# Patient Record
Sex: Female | Born: 1985
Health system: Southern US, Community
[De-identification: ages and names within clinical notes are randomized; demographics above are authoritative.]

## PROBLEM LIST (undated history)

## (undated) ENCOUNTER — Inpatient Hospital Stay (HOSPITAL_COMMUNITY): Payer: Self-pay

## (undated) DIAGNOSIS — J45909 Unspecified asthma, uncomplicated: Secondary | ICD-10-CM

## (undated) DIAGNOSIS — Z975 Presence of (intrauterine) contraceptive device: Secondary | ICD-10-CM

## (undated) HISTORY — PX: WISDOM TOOTH EXTRACTION: SHX21

## (undated) HISTORY — DX: Presence of (intrauterine) contraceptive device: Z97.5

---
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
## 2006-08-30 ENCOUNTER — Inpatient Hospital Stay (HOSPITAL_COMMUNITY): Admission: AD | Admit: 2006-08-30 | Discharge: 2006-08-31 | Payer: Self-pay | Admitting: Obstetrics and Gynecology

## 2006-09-04 ENCOUNTER — Inpatient Hospital Stay (HOSPITAL_COMMUNITY): Admission: AD | Admit: 2006-09-04 | Discharge: 2006-09-04 | Payer: Self-pay | Admitting: Obstetrics and Gynecology

## 2006-09-18 ENCOUNTER — Encounter: Admission: RE | Admit: 2006-09-18 | Discharge: 2006-09-18 | Payer: Self-pay | Admitting: Obstetrics and Gynecology

## 2006-09-26 ENCOUNTER — Inpatient Hospital Stay (HOSPITAL_COMMUNITY): Admission: AD | Admit: 2006-09-26 | Discharge: 2006-09-26 | Payer: Self-pay | Admitting: Obstetrics and Gynecology

## 2006-11-19 ENCOUNTER — Inpatient Hospital Stay (HOSPITAL_COMMUNITY): Admission: AD | Admit: 2006-11-19 | Discharge: 2006-11-19 | Payer: Self-pay | Admitting: Obstetrics and Gynecology

## 2006-11-21 ENCOUNTER — Inpatient Hospital Stay (HOSPITAL_COMMUNITY): Admission: AD | Admit: 2006-11-21 | Discharge: 2006-11-21 | Payer: Self-pay | Admitting: Obstetrics and Gynecology

## 2006-11-24 ENCOUNTER — Inpatient Hospital Stay (HOSPITAL_COMMUNITY): Admission: AD | Admit: 2006-11-24 | Discharge: 2006-11-27 | Payer: Self-pay | Admitting: Obstetrics and Gynecology

## 2007-11-03 ENCOUNTER — Emergency Department (HOSPITAL_COMMUNITY): Admission: EM | Admit: 2007-11-03 | Discharge: 2007-11-03 | Payer: Self-pay | Admitting: Emergency Medicine

## 2008-04-26 ENCOUNTER — Emergency Department (HOSPITAL_COMMUNITY): Admission: EM | Admit: 2008-04-26 | Discharge: 2008-04-26 | Payer: Self-pay | Admitting: Emergency Medicine

## 2009-01-04 ENCOUNTER — Emergency Department (HOSPITAL_COMMUNITY): Admission: EM | Admit: 2009-01-04 | Discharge: 2009-01-04 | Payer: Self-pay | Admitting: Emergency Medicine

## 2009-10-04 ENCOUNTER — Emergency Department (HOSPITAL_COMMUNITY): Admission: EM | Admit: 2009-10-04 | Discharge: 2009-10-04 | Payer: Self-pay | Admitting: Emergency Medicine

## 2009-12-29 ENCOUNTER — Emergency Department (HOSPITAL_COMMUNITY)
Admission: EM | Admit: 2009-12-29 | Discharge: 2009-12-29 | Payer: Self-pay | Source: Home / Self Care | Admitting: Family Medicine

## 2010-06-05 NOTE — H&P (Signed)
Katie Riggs, Katie Riggs           ACCOUNT NO.:  [id number]   MEDICAL RECORD NO.:  [id number]          PATIENT TYPE:  INP   LOCATION:  9169                          FACILITY:  [facility]   PHYSICIAN:  Crist Fat. Rivard, M.D. DATE OF BIRTH:  [birth year]   DATE OF ADMISSION:  11/24/2006  DATE OF DISCHARGE:                              HISTORY & PHYSICAL   HISTORY:  This is a 25 year old, gravida 1, para 0 at 41 weeks who  presents for induction of labor secondary to oligohydramnios.  She has  had polyhydramnios and fetal renal cyst in the past which both resolved,  and a repeat ultrasound, today, showed an AFI of 6 and a BPP of 8/8.   HISTORY HAS BEEN REMARKABLE FOR:  1. Late care.  2. Second trimester GC  3. Resolved polyhydramnios.  4. Resolved fetal renal cyst.  5. Group B strep negative.   ALLERGIES:  Seasonal.   OB HISTORY:  The patient is a primigravida.   MEDICAL HISTORY:  Remarkable for childhood varicella.   SURGICAL HISTORY:  Remarkable for wisdom teeth in 2005.   FAMILY HISTORY:  Remarkable for a cousin with anemia, grandfather with  emphysema, and a mother with stroke.   GENETIC HISTORY:  Remarkable for father of the baby's father with twins.   SOCIAL HISTORY:  The patient is single.  Father of the baby, Katie Riggs  is involved and supportive.  She is of the [place] faith.  She denies  any alcohol, tobacco, or drug use.   PRENATAL LABS:  Hemoglobin 12.4, platelets 265.  Blood type A+, antibody  screen negative, RPR nonreactive, rubella immune, hepatitis negative,  HIV negative.  Gonorrhea positive at new OB and chlamydia negative.  Cystic fibrosis declined.   HISTORY OF CURRENT PREGNANCY:  The patient entered care at 19 weeks.  She had quad screen and new OB labs done.  He had an ultrasound that was  normal, and she was treated for UTI at that time.  She had a positive  gonorrhea culture at that time, and was treated with Rocephin.  She had  another ultrasound at  21 weeks to complete anatomy scan.  She had a Pap  result that showed inflammation.  She had test of cure that was negative  at 23 weeks.  Glucola was 130.  She had some preterm labor at 29 weeks  that was treated with terbutaline.  Fetal fibronectin was negative.  She  had a ultrasound at 31 weeks showing an AFI of 36, and was placed on low  carb diet.  She went to a nutritionist for her diet teaching.  She had a  biophysical profile at 32 weeks that was 6/8.  AFI was down to 29, at  that point.  She had a reactive NST.  She had another ultrasound at 34  weeks showing renal cyst had resolved.  Fluid was at 96 percentile.  Fluid level was stable at 35 weeks.  Group B strep was negative.  Cultures were also negative.  She had a biophysical profile 8/8 at 36  weeks, reactive NST at 37 weeks, and a normal  ultrasound.  Ultrasound at  38 weeks showed normal fluid, and she presented at 39 weeks for another  ultrasound that was normal with an AFI of 13.  She presented, today, in  the office of with an AFI of 6.   OBJECTIVE:  VITAL SIGNS:  Stable, afebrile.  HEENT: Within normal limits.  NECK:  Thyroid normal, not enlarged.  CHEST:  Clear to auscultation.  HEART:  Regular rate and rhythm.  ABDOMEN:  Gravid.  Vertex by Thayer Ohm.  FM shows reactive fetal heart  rate with irregular contractions.  Cervix is 1-2 cm, 85% effaced, minus  one station with a vertex presentation.  EXTREMITIES:  Within normal limits.   ASSESSMENT:  1. Intrauterine pregnancy at 41 weeks.  2. Oligohydramnios.   PLAN:  1. Admit to birthing suite per Dr. Estanislado Pandy.  2. Routine CNM orders.  3. Pitocin induction of labor via low dose.      Katie Riggs, C.N.M.      Crist Fat Rivard, M.D.  Electronically Signed    MLW/MEDQ  D:  11/24/2006  T:  11/25/2006  Job:  [id number]

## 2010-10-30 LAB — CBC
HCT: 40.6
Hemoglobin: 14.3
MCHC: 35.1
MCHC: 35.2
MCV: 86.5
Platelets: 217
Platelets: 253
RBC: 3.52 — ABNORMAL LOW
RBC: 4.69
RDW: 13.5
WBC: 8

## 2010-10-31 LAB — URINALYSIS, ROUTINE W REFLEX MICROSCOPIC
Glucose, UA: NEGATIVE
Hgb urine dipstick: NEGATIVE
Specific Gravity, Urine: 1.02
pH: 6

## 2010-10-31 LAB — CBC
MCV: 87
Platelets: 242
RBC: 4.39
WBC: 6.9

## 2010-10-31 LAB — URINE MICROSCOPIC-ADD ON

## 2010-10-31 LAB — DIFFERENTIAL
Eosinophils Absolute: 0.1
Lymphocytes Relative: 20
Lymphs Abs: 1.4
Monocytes Relative: 13 — ABNORMAL HIGH
Neutro Abs: 4.5
Neutrophils Relative %: 65

## 2010-11-02 LAB — WET PREP, GENITAL: Yeast Wet Prep HPF POC: NONE SEEN

## 2010-11-02 LAB — GC/CHLAMYDIA PROBE AMP, GENITAL
Chlamydia, DNA Probe: NEGATIVE
GC Probe Amp, Genital: NEGATIVE

## 2010-11-05 LAB — URINE MICROSCOPIC-ADD ON: WBC, UA: NONE SEEN

## 2010-11-05 LAB — STREP B DNA PROBE: Strep Group B Ag: NEGATIVE

## 2010-11-05 LAB — FETAL FIBRONECTIN: Fetal Fibronectin: NEGATIVE

## 2010-11-05 LAB — URINALYSIS, ROUTINE W REFLEX MICROSCOPIC
Bilirubin Urine: NEGATIVE
Specific Gravity, Urine: 1.025
Urobilinogen, UA: 1
pH: 6

## 2010-11-05 LAB — GC/CHLAMYDIA PROBE AMP, GENITAL: Chlamydia, DNA Probe: NEGATIVE

## 2010-11-05 LAB — WET PREP, GENITAL
Clue Cells Wet Prep HPF POC: NONE SEEN
Yeast Wet Prep HPF POC: NONE SEEN

## 2011-07-31 ENCOUNTER — Other Ambulatory Visit (HOSPITAL_COMMUNITY)
Admission: RE | Admit: 2011-07-31 | Discharge: 2011-07-31 | Disposition: A | Discharge status: Home / Self Care | Payer: 59 | Source: Ambulatory Visit | Attending: Family Medicine | Admitting: Family Medicine

## 2011-07-31 DIAGNOSIS — Z01419 Encounter for gynecological examination (general) (routine) without abnormal findings: Secondary | ICD-10-CM | POA: Insufficient documentation

## 2011-07-31 DIAGNOSIS — R87810 Cervical high risk human papillomavirus (HPV) DNA test positive: Secondary | ICD-10-CM | POA: Insufficient documentation

## 2011-10-13 ENCOUNTER — Emergency Department (HOSPITAL_COMMUNITY)
Admission: EM | Admit: 2011-10-13 | Discharge: 2011-10-13 | Disposition: A | Discharge status: Home / Self Care | Payer: 59 | Attending: Emergency Medicine | Admitting: Emergency Medicine

## 2011-10-13 ENCOUNTER — Encounter (HOSPITAL_COMMUNITY): Payer: Self-pay | Admitting: Emergency Medicine

## 2011-10-13 DIAGNOSIS — L03119 Cellulitis of unspecified part of limb: Secondary | ICD-10-CM | POA: Insufficient documentation

## 2011-10-13 DIAGNOSIS — L02419 Cutaneous abscess of limb, unspecified: Secondary | ICD-10-CM | POA: Insufficient documentation

## 2011-10-13 DIAGNOSIS — L0390 Cellulitis, unspecified: Secondary | ICD-10-CM

## 2011-10-13 HISTORY — DX: Unspecified asthma, uncomplicated: J45.909

## 2011-10-13 MED ORDER — CLINDAMYCIN HCL 300 MG PO CAPS: 300.0000 mg | ORAL_CAPSULE | Freq: Four times a day (QID) | ORAL | Status: DC

## 2011-10-13 MED ORDER — CLINDAMYCIN HCL 300 MG PO CAPS
300.0000 mg | ORAL_CAPSULE | Freq: Once | ORAL | Status: AC
  Administered 2011-10-13: 300 mg via ORAL
  Filled 2011-10-13: qty 1

## 2011-10-13 NOTE — ED Notes (Signed)
Pt has raised red area to lateral side of RLE. Pt states area has had some drainage. Area warm, and painful to touch.

## 2011-10-13 NOTE — ED Notes (Signed)
Pt alert arrives from home, c/o "spider bite" to right lower ext, onset unknown, area reddened, + drainage, DSD applied

## 2011-10-13 NOTE — ED Provider Notes (Signed)
History     CSN: [id number]  Arrival date & time 10/13/11  1929   First MD Initiated Contact with Patient 10/13/11 2130      Chief Complaint  Patient presents with  . Wound Infection    (Consider location/radiation/quality/duration/timing/severity/associated sxs/prior treatment) HPI Comments: 26 year old female presents the emergency department complaining of a spider bite to the right lower leg. She states she noticed this yesterday. She says that the area feels red and warm. Redness has increased since yesterday. Mild drainage was present yesterday. She did not see anything bite her. Denies any fever, however she states she has had intermittent chills. Denies any nausea or vomiting.  The history is provided by the patient.    Past Medical History  Diagnosis Date  . Asthma     History reviewed. No pertinent past surgical history.  No family history on file.  History  Substance Use Topics  . Smoking status: Never Smoker   . Smokeless tobacco: Not on file  . Alcohol Use: No    OB History    Grav Para Term Preterm Abortions TAB SAB Ect Mult Living                  Review of Systems  Constitutional: Positive for chills. Negative for fever.  Gastrointestinal: Negative for nausea.  Musculoskeletal: Negative for arthralgias.  Skin: Positive for color change. Negative for rash.    Allergies  Review of patient's allergies indicates no known allergies.  Home Medications   Current Outpatient Rx  Name Route Sig Dispense Refill  . LEVONORGESTREL 20 MCG/24HR IU IUD Intrauterine 1 each by Intrauterine route once.      BP 115/77  Pulse 87  Temp 98 F (36.7 C) (Oral)  Resp 16  Wt 175 lb (79.379 kg)  SpO2 99%  Physical Exam  Nursing note and vitals reviewed. Constitutional: She is oriented to person, place, and time. She appears well-developed and well-nourished. No distress.  HENT:  Head: Normocephalic and atraumatic.  Eyes: Conjunctivae normal are normal.    Neck: Normal range of motion.  Cardiovascular: Normal rate and normal heart sounds.   Pulmonary/Chest: Effort normal and breath sounds normal.  Neurological: She is alert and oriented to person, place, and time.  Skin: Skin is warm. She is not diaphoretic. There is erythema (lateral aspect of right lower leg approximately 3 in diameter. no active drainage. no induration. warm to touch).  Psychiatric: She has a normal mood and affect. Her behavior is normal.    ED Course  Procedures (including critical care time)  Labs Reviewed - No data to display No results found.   1. Cellulitis       MDM  26 year old female with cellulitis of the right lower leg. She is afebrile and in no apparent distress. Her vitals are stable. Markings drawn around the area of erythema. Instructions given to watch for any spreading of the erythema or fever. Advised her to return to the emergency department either these occur. I will treat her cellulitis. There is no abscess to drain.        Trevor Mace, PA-C 10/13/11 2233

## 2011-10-15 NOTE — ED Provider Notes (Signed)
Medical screening examination/treatment/procedure(s) were performed by non-physician practitioner and as supervising physician I was immediately available for consultation/collaboration.  Flint Melter, MD 10/15/11 [phone]

## 2012-01-11 ENCOUNTER — Encounter (HOSPITAL_COMMUNITY): Payer: Self-pay | Admitting: Emergency Medicine

## 2012-01-11 ENCOUNTER — Emergency Department (HOSPITAL_COMMUNITY)
Admission: EM | Admit: 2012-01-11 | Discharge: 2012-01-11 | Disposition: A | Discharge status: Home / Self Care | Payer: 59 | Attending: Emergency Medicine | Admitting: Emergency Medicine

## 2012-01-11 DIAGNOSIS — R112 Nausea with vomiting, unspecified: Secondary | ICD-10-CM | POA: Insufficient documentation

## 2012-01-11 DIAGNOSIS — Z79899 Other long term (current) drug therapy: Secondary | ICD-10-CM | POA: Insufficient documentation

## 2012-01-11 DIAGNOSIS — I10 Essential (primary) hypertension: Secondary | ICD-10-CM | POA: Insufficient documentation

## 2012-01-11 DIAGNOSIS — R059 Cough, unspecified: Secondary | ICD-10-CM | POA: Insufficient documentation

## 2012-01-11 DIAGNOSIS — R5383 Other fatigue: Secondary | ICD-10-CM | POA: Insufficient documentation

## 2012-01-11 DIAGNOSIS — J111 Influenza due to unidentified influenza virus with other respiratory manifestations: Secondary | ICD-10-CM | POA: Insufficient documentation

## 2012-01-11 DIAGNOSIS — R6883 Chills (without fever): Secondary | ICD-10-CM | POA: Insufficient documentation

## 2012-01-11 DIAGNOSIS — R630 Anorexia: Secondary | ICD-10-CM | POA: Insufficient documentation

## 2012-01-11 DIAGNOSIS — J3489 Other specified disorders of nose and nasal sinuses: Secondary | ICD-10-CM | POA: Insufficient documentation

## 2012-01-11 DIAGNOSIS — J029 Acute pharyngitis, unspecified: Secondary | ICD-10-CM | POA: Insufficient documentation

## 2012-01-11 DIAGNOSIS — IMO0001 Reserved for inherently not codable concepts without codable children: Secondary | ICD-10-CM | POA: Insufficient documentation

## 2012-01-11 DIAGNOSIS — R5381 Other malaise: Secondary | ICD-10-CM | POA: Insufficient documentation

## 2012-01-11 DIAGNOSIS — J45909 Unspecified asthma, uncomplicated: Secondary | ICD-10-CM | POA: Insufficient documentation

## 2012-01-11 DIAGNOSIS — R05 Cough: Secondary | ICD-10-CM | POA: Insufficient documentation

## 2012-01-11 MED ORDER — OSELTAMIVIR PHOSPHATE 75 MG PO CAPS: 75.0000 mg | ORAL_CAPSULE | Freq: Two times a day (BID) | ORAL | Status: DC

## 2012-01-11 NOTE — ED Notes (Signed)
Pt notices onset of body aches and chills last p.m.  Emesis x 1 this a.m., also now has sore throat, headache and body aches continue

## 2012-01-11 NOTE — ED Provider Notes (Signed)
History     CSN: [id number]  Arrival date & time 01/11/12  1350   First MD Initiated Contact with Patient 01/11/12 [phone]      Chief Complaint  Patient presents with  . Influenza    (Consider location/radiation/quality/duration/timing/severity/associated sxs/prior treatment) HPI Comments: Pt presents to the ED with complaints of flu-like symptoms of dry cough, congestion, sore throat, muscle aches, chills, subjective fever, headache, fatigue, nausea, and one episode of vomiting. The patient states that the symptoms started last evening.  Pt has been around other sick contacts and did not get the flu shot this year. The patient denies neck pain or stiffness, weakness, vision changes, severe abdominal pain, inability to eat or drink, difficulty breathing, SOB, wheezing, or chest pain. The patient has not taken anything for her symptoms.     Patient is a 26 y.o. female presenting with flu symptoms. The history is provided by the patient.  Influenza Associated symptoms include chills, congestion, coughing, fatigue, headaches, myalgias, nausea, a sore throat and vomiting. Pertinent negatives include no abdominal pain, chest pain, neck pain or rash.    Past Medical History  Diagnosis Date  . Asthma     History reviewed. No pertinent past surgical history.  No family history on file.  History  Substance Use Topics  . Smoking status: Never Smoker   . Smokeless tobacco: Never Used  . Alcohol Use: Yes     Comment: ocassionally beer or wine    OB History    Grav Para Term Preterm Abortions TAB SAB Ect Mult Living                  Review of Systems  Constitutional: Positive for chills, appetite change and fatigue.       Subjective fever  HENT: Positive for congestion, sore throat and rhinorrhea. Negative for ear pain, drooling, trouble swallowing, neck pain, neck stiffness and voice change.   Respiratory: Positive for cough. Negative for shortness of breath.   Cardiovascular:  Negative for chest pain.  Gastrointestinal: Positive for nausea and vomiting. Negative for abdominal pain, diarrhea and constipation.  Musculoskeletal: Positive for myalgias.  Skin: Negative for rash.  Neurological: Positive for headaches. Negative for dizziness, syncope and light-headedness.  Psychiatric/Behavioral: Negative for confusion.  All other systems reviewed and are negative.    Allergies  Review of patient's allergies indicates no known allergies.  Home Medications   Current Outpatient Rx  Name  Route  Sig  Dispense  Refill  . ALBUTEROL SULFATE HFA 108 (90 BASE) MCG/ACT IN AERS   Inhalation   Inhale 2 puffs into the lungs every 6 (six) hours as needed. For shortness of breath.         Marland Kitchen LEVONORGESTREL 20 MCG/24HR IU IUD   Intrauterine   1 each by Intrauterine route once.           BP 108/64  Pulse 84  Temp 98.8 F (37.1 C) (Oral)  Resp 14  SpO2 99%  Physical Exam  Nursing note and vitals reviewed. Constitutional: She appears well-developed and well-nourished. No distress.  HENT:  Head: Normocephalic and atraumatic.  Mouth/Throat: Oropharynx is clear and moist.  Neck: Normal range of motion. Neck supple.  Cardiovascular: Normal rate, regular rhythm and normal heart sounds.   Pulmonary/Chest: Effort normal and breath sounds normal. No respiratory distress. She has no wheezes. She has no rales. She exhibits no tenderness.  Abdominal: Soft. There is no tenderness.  Musculoskeletal: Normal range of motion.  Lymphadenopathy:  She has no cervical adenopathy.  Neurological: She is alert.  Skin: Skin is warm and dry. No rash noted. She is not diaphoretic.  Psychiatric: She has a normal mood and affect.    ED Course  Procedures (including critical care time)  Labs Reviewed - No data to display No results found.   No diagnosis found.    MDM  Patient with symptoms consistent with influenza.  Vitals are stable.  No signs of dehydration, tolerating  PO's.  Lungs are clear. Due to patient's presentation and physical exam a chest x-ray was not ordered bc likely diagnosis of flu.  Discussed the cost versus benefit of Tamiflu treatment with the patient.  Patient within the 48 hour window.   Patient will be discharged with Rx for Tamiflu and instructions to orally hydrate, rest, and use over-the-counter medications such as anti-inflammatories ibuprofen and Aleve for muscle aches and Tylenol for fever.          Pascal Lux [place], PA-C 01/11/12 1715

## 2012-01-11 NOTE — ED Provider Notes (Signed)
Medical screening examination/treatment/procedure(s) were performed by non-physician practitioner and as supervising physician I was immediately available for consultation/collaboration.   Kendrix Orman, MD 01/11/12 1737 

## 2012-01-20 ENCOUNTER — Ambulatory Visit (INDEPENDENT_AMBULATORY_CARE_PROVIDER_SITE_OTHER): Payer: 59 | Admitting: Medical

## 2012-01-20 ENCOUNTER — Encounter: Payer: Self-pay | Admitting: Medical

## 2012-01-20 VITALS — BP 100/70 | HR 70 | Temp 98.3°F | Resp 16 | Wt 175.0 lb

## 2012-01-20 DIAGNOSIS — Z113 Encounter for screening for infections with a predominantly sexual mode of transmission: Secondary | ICD-10-CM

## 2012-01-20 DIAGNOSIS — N898 Other specified noninflammatory disorders of vagina: Secondary | ICD-10-CM

## 2012-01-20 DIAGNOSIS — A499 Bacterial infection, unspecified: Secondary | ICD-10-CM

## 2012-01-20 DIAGNOSIS — N760 Acute vaginitis: Secondary | ICD-10-CM

## 2012-01-20 LAB — GC/CHLAMYDIA PROBE AMP, GENITAL

## 2012-01-20 LAB — SYPHILIS: RPR W/REFLEX TO RPR TITER AND TREPONEMAL ANTIBODIES, TRADITIONAL SCREENING AND DIAGNOSIS ALGORITHM

## 2012-01-20 MED ORDER — METRONIDAZOLE 500 MG PO TABS: ORAL_TABLET | ORAL | Status: DC

## 2012-01-20 NOTE — Progress Notes (Signed)
Subjective: Here as a new patient today.  Here for 1.5 wk hx/o vaginal discharge and odor.  Discharge is liquid, no cheesy, light green to clear at times, odorous.  Doesn't normally have a discharge.  No hx/o STD.  She has had yeast infection prior, but no hx/o other discharge.  Last pap normal in September at [facility] in [place].  LMP unsure as she has been on mirena IUD for a while now.  She did change soaps recently but switched back to her normal soap.  Has monogamous relationship x 1+ year.  Would like baseline STD screening.  Last screening a year ago normal.  Past Medical History  Diagnosis Date  . Asthma    ROS as in subjective  Objective: Gen: wd, wn, nad, AA female Gyn: normal external genitalia, clitoral piercing present, creamy yellowish discharge, cervical normal appearing, IUD strings in place, swabs taken, exam chaperoned by nurse  Assessment: Encounter Diagnoses  Name Primary?  . Vaginal discharge Yes  . Bacterial vaginosis   . Screen for STD (sexually transmitted disease)    Plan: Wet prep/KOH prep with numerous clue cells, but no yeast, no trich.  Will send labs for STD screening  Discussed BV.  Will treat with metronidazole 2g today.  Advised against alcohol while on the medication, use additional forms of contraception for the next several weeks.  Call if not improving.

## 2012-01-24 LAB — POCT WET PREP (WET MOUNT)
Bacteria Wet Prep HPF POC: NEGATIVE
Clue Cells Wet Prep Whiff POC: POSITIVE
WBC, Wet Prep HPF POC: NEGATIVE

## 2012-01-27 ENCOUNTER — Other Ambulatory Visit: Payer: Self-pay | Admitting: Medical

## 2012-01-27 ENCOUNTER — Telehealth: Payer: Self-pay | Admitting: Medical

## 2012-01-27 MED ORDER — METRONIDAZOLE 500 MG PO TABS: ORAL_TABLET | ORAL | Status: DC

## 2012-01-27 NOTE — Telephone Encounter (Signed)
Refill sent, but take BID x 1 week

## 2012-02-10 ENCOUNTER — Telehealth: Payer: Self-pay | Admitting: Family Medicine

## 2012-02-10 NOTE — Telephone Encounter (Signed)
Patient called over the weekend about Vaginitis Sx. And wanting another Rx. CLS [phone]

## 2012-02-10 NOTE — Telephone Encounter (Signed)
At this point, I believe we have done 2 rounds of flagyl.  If not improved, have her come in for recheck, repeat swab.

## 2012-02-11 ENCOUNTER — Ambulatory Visit: Payer: 59 | Admitting: Medical

## 2012-02-11 NOTE — Telephone Encounter (Signed)
PT INFORMED, APPT MADE

## 2012-02-12 ENCOUNTER — Encounter: Payer: Self-pay | Admitting: Medical

## 2012-02-12 ENCOUNTER — Ambulatory Visit (INDEPENDENT_AMBULATORY_CARE_PROVIDER_SITE_OTHER): Payer: 59 | Admitting: Medical

## 2012-02-12 VITALS — BP 120/80 | HR 62 | Temp 98.3°F | Resp 16 | Wt 172.0 lb

## 2012-02-12 DIAGNOSIS — N760 Acute vaginitis: Secondary | ICD-10-CM

## 2012-02-12 DIAGNOSIS — B373 Candidiasis of vulva and vagina: Secondary | ICD-10-CM

## 2012-02-12 DIAGNOSIS — B9689 Other specified bacterial agents as the cause of diseases classified elsewhere: Secondary | ICD-10-CM

## 2012-02-12 DIAGNOSIS — A499 Bacterial infection, unspecified: Secondary | ICD-10-CM

## 2012-02-12 DIAGNOSIS — N898 Other specified noninflammatory disorders of vagina: Secondary | ICD-10-CM

## 2012-02-12 LAB — POCT WET PREP (WET MOUNT)
Bacteria Wet Prep HPF POC: NORMAL
Clue Cells Wet Prep Whiff POC: POSITIVE
WBC, Wet Prep HPF POC: NORMAL

## 2012-02-12 MED ORDER — NYSTATIN 100000 UNIT/GM EX CREA: TOPICAL_CREAM | Freq: Two times a day (BID) | CUTANEOUS | Status: DC

## 2012-02-12 MED ORDER — FLUCONAZOLE 100 MG PO TABS: 100.0000 mg | ORAL_TABLET | Freq: Every day | ORAL | Status: DC

## 2012-02-12 MED ORDER — METRONIDAZOLE 0.75 % EX GEL: Freq: Two times a day (BID) | CUTANEOUS | Status: DC

## 2012-02-12 NOTE — Patient Instructions (Signed)
Begin Diflucan daily for 1 week.  You can use the topical Nystatin cream in between the legs.    Consider taking Acidophilus tablets or Activa yogurt daily for a few weeks.  Drink plenty of water daily, avoid lots of soda and sugary foods/beverages.  If the discharge doesn't seem to be resolving, mainly clear/watery to white discharge, then you can use the Metrogel vaginal suppository twice daily for 1 week, then 2 times per week for a few weeks thereafter.   Let me know if this isn't working.

## 2012-02-12 NOTE — Progress Notes (Signed)
Subjective: Here for recheck.   I saw her here few weeks ago for vaginal discharge and odor.   I treated her for BV, and at this point she has been on 2 rounds of Metronidazole.  The odor is better, but still having lots of vaginal discharge, and discharge is a little different that prior, more thick and white.   No prior hx/o BV, but has had yeast infections prior.  She was on Amoxicillin about 2 weeks before seeing me the last time.  No hx/o STD.  LMP unsure as she has been on mirena IUD for a while now.    Past Medical History  Diagnosis Date  . Asthma   . IUD (intrauterine device) in place     Mirena   ROS as in subjective  Objective: Gen: wd, wn, nad, AA female Gyn: normal external genitalia, clitoral piercing present, thick white discharge, cervical normal appearing, IUD strings in place, swabs taken, exam chaperoned by nurse  Assessment: Encounter Diagnoses  Name Primary?  . Yeast vaginitis Yes  . Vaginal discharge   . BV (bacterial vaginosis)    Plan: Wet prep/KOH prep with mild-moderate number of clue cells, numerous yeast, no trich.    Begin Diflucan 100mg  daily x 7 days, acidophilis tablets daily OTC for a few weeks, avoid sugary drinks/foods, hydrate well, avoid new soaps, body washes, and can use metronidazole vaginal gel daily for 1wk, then if still having discharge, can use 2 x/wk for 3-4 wk.    Call if not improving in 2 wk.

## 2012-02-19 DIAGNOSIS — N898 Other specified noninflammatory disorders of vagina: Secondary | ICD-10-CM | POA: Insufficient documentation

## 2012-02-19 NOTE — Addendum Note (Signed)
Addended by: Jac Canavan on: 02/19/2012 03:52 AM   Modules accepted: Level of Service

## 2012-03-07 ENCOUNTER — Other Ambulatory Visit: Payer: Self-pay

## 2012-04-10 ENCOUNTER — Telehealth: Payer: Self-pay | Admitting: Family Medicine

## 2012-04-10 ENCOUNTER — Encounter: Payer: Self-pay | Admitting: Medical

## 2012-04-10 ENCOUNTER — Ambulatory Visit (INDEPENDENT_AMBULATORY_CARE_PROVIDER_SITE_OTHER): Payer: 59 | Admitting: Medical

## 2012-04-10 VITALS — BP 100/70 | HR 82 | Temp 98.5°F | Resp 16 | Wt 171.0 lb

## 2012-04-10 DIAGNOSIS — N760 Acute vaginitis: Secondary | ICD-10-CM

## 2012-04-10 LAB — POCT WET PREP (WET MOUNT): KOH Wet Prep POC: POSITIVE

## 2012-04-10 MED ORDER — FLUCONAZOLE 100 MG PO TABS: 100.0000 mg | ORAL_TABLET | Freq: Every day | ORAL | Status: DC

## 2012-04-10 NOTE — Progress Notes (Signed)
Subjective: Here for vaginal itching and discomfort x 5 days, has had some thick white stringy discharge.  No other symptoms.  She denies urinary symptoms.  She is on Mirena IUD since 2010.  Monogamous relationship but would like chlamydia/gonorrhea testing today just to make sure.  She and partner both clean before intercourse, and she urinates after intercourse.  Since last visit using acidophilus tablets, avoiding sugary foods, using dove sensitive skin soap for hygiene, no bubble baths, no douching.  Of note, last physical, labs and HgbA1C normal and glucose 80 within the past year.  No hx/o diabetes, no polyuria, no polydipsia, no other known immunocompromised state.  No other c/o.   Past Medical History  Diagnosis Date  . Asthma   . IUD (intrauterine device) in place     Mirena   ROS as in subjective  Objective: Gen: wd, wn, nad, AA female Gyn: normal external genitalia, clitoral piercing present, mild white discharge,somewhat thick, cervical normal appearing, IUD strings in place, swabs taken, exam chaperoned by nurse  Assessment: Encounter Diagnosis  Name Primary?  . Vaginitis and vulvovaginitis Yes   Plan: Wet prep/KOH prep with numerous yeast, no trich, few clue cells.  Will send wet prep out for confirmation.  GC/chlamydia swabs sent although not suspected today.    Begin Diflucan 100mg  daily x 7 days, c/t acidophilus tablets daily OTC, avoid sugary drinks/foods, hydrate well, avoid new soaps, body washes, and call if not improving in 1wk.

## 2012-04-11 LAB — WET PREP, GENITAL

## 2012-04-13 ENCOUNTER — Telehealth: Payer: Self-pay | Admitting: Family Medicine

## 2012-04-13 NOTE — Telephone Encounter (Signed)
Patient is aware of her medication and Kristian Covey PA-C recommendations on her treatment. CLS

## 2012-04-13 NOTE — Telephone Encounter (Signed)
Message copied by Janeice Robinson on Mon Apr 13, 2012  7:51 AM ------      Message from: Jac Canavan      Created: Fri Apr 10, 2012  6:39 PM       Use the Diflucan for yeast infection, 1 tablet daily for a week.  I did put a refill in the event she gets another yeast infection in the next few months.  Monistat cream OTC can also be used.            Chronic yeast infections would be 4 or more yeast infections in a year.   She hasn't had that many.  If she continues to get yeast infections, then we will consider preventative therapy where she would take diflucan weekly for up to 31mo.  Another option is boric acid suppositories.  I don't think this is the way to go for now though.            Have her keep in touch.  If not sure about symptoms, she can call.   ------

## 2012-04-29 NOTE — Telephone Encounter (Signed)
DONE1

## 2012-05-07 ENCOUNTER — Emergency Department (HOSPITAL_COMMUNITY): Payer: 59

## 2012-05-07 ENCOUNTER — Encounter (HOSPITAL_COMMUNITY): Payer: Self-pay | Admitting: Emergency Medicine

## 2012-05-07 ENCOUNTER — Emergency Department (HOSPITAL_COMMUNITY)
Admission: EM | Admit: 2012-05-07 | Discharge: 2012-05-07 | Disposition: A | Discharge status: Home / Self Care | Payer: 59 | Attending: Emergency Medicine | Admitting: Emergency Medicine

## 2012-05-07 DIAGNOSIS — J45909 Unspecified asthma, uncomplicated: Secondary | ICD-10-CM | POA: Insufficient documentation

## 2012-05-07 DIAGNOSIS — Z79899 Other long term (current) drug therapy: Secondary | ICD-10-CM | POA: Insufficient documentation

## 2012-05-07 DIAGNOSIS — Z3202 Encounter for pregnancy test, result negative: Secondary | ICD-10-CM | POA: Insufficient documentation

## 2012-05-07 DIAGNOSIS — IMO0002 Reserved for concepts with insufficient information to code with codable children: Secondary | ICD-10-CM | POA: Insufficient documentation

## 2012-05-07 DIAGNOSIS — R110 Nausea: Secondary | ICD-10-CM | POA: Insufficient documentation

## 2012-05-07 DIAGNOSIS — R109 Unspecified abdominal pain: Secondary | ICD-10-CM | POA: Insufficient documentation

## 2012-05-07 LAB — URINALYSIS, MICROSCOPIC ONLY
Glucose, UA: NEGATIVE mg/dL
Leukocytes, UA: NEGATIVE
Specific Gravity, Urine: 1.033 — ABNORMAL HIGH (ref 1.005–1.030)
Urobilinogen, UA: 1 mg/dL (ref 0.0–1.0)

## 2012-05-07 LAB — CBC WITH DIFFERENTIAL/PLATELET
Basophils Absolute: 0 10*3/uL (ref 0.0–0.1)
Basophils Relative: 0 % (ref 0–1)
Eosinophils Absolute: 0 10*3/uL (ref 0.0–0.7)
Eosinophils Relative: 0 % (ref 0–5)
MCH: 29.3 pg (ref 26.0–34.0)
MCV: 81.3 fL (ref 78.0–100.0)
Platelets: 268 10*3/uL (ref 150–400)
RDW: 12.5 % (ref 11.5–15.5)
WBC: 5.2 10*3/uL (ref 4.0–10.5)

## 2012-05-07 LAB — COMPREHENSIVE METABOLIC PANEL
ALT: 9 U/L (ref 0–35)
AST: 17 U/L (ref 0–37)
Calcium: 9.1 mg/dL (ref 8.4–10.5)
Sodium: 135 mEq/L (ref 135–145)
Total Protein: 8.4 g/dL — ABNORMAL HIGH (ref 6.0–8.3)

## 2012-05-07 MED ORDER — HYDROMORPHONE HCL PF 1 MG/ML IJ SOLN
0.5000 mg | Freq: Once | INTRAMUSCULAR | Status: AC
  Administered 2012-05-07: 0.5 mg via INTRAVENOUS
  Filled 2012-05-07: qty 1

## 2012-05-07 MED ORDER — HYDROCODONE-ACETAMINOPHEN 5-325 MG PO TABS: 1.0000 | ORAL_TABLET | ORAL | Status: DC | PRN

## 2012-05-07 MED ORDER — SODIUM CHLORIDE 0.9 % IV SOLN
Freq: Once | INTRAVENOUS | Status: AC
  Administered 2012-05-07: 12:00:00 via INTRAVENOUS

## 2012-05-07 MED ORDER — IOHEXOL 300 MG/ML  SOLN
100.0000 mL | Freq: Once | INTRAMUSCULAR | Status: AC | PRN
  Administered 2012-05-07: 100 mL via INTRAVENOUS

## 2012-05-07 MED ORDER — ONDANSETRON HCL 4 MG PO TABS: 4.0000 mg | ORAL_TABLET | Freq: Four times a day (QID) | ORAL | Status: DC

## 2012-05-07 MED ORDER — IOHEXOL 300 MG/ML  SOLN
25.0000 mL | INTRAMUSCULAR | Status: AC
  Administered 2012-05-07 (×2): 25 mL via ORAL

## 2012-05-07 MED ORDER — ONDANSETRON HCL 4 MG/2ML IJ SOLN
4.0000 mg | Freq: Once | INTRAMUSCULAR | Status: AC
  Administered 2012-05-07: 4 mg via INTRAVENOUS
  Filled 2012-05-07: qty 2

## 2012-05-07 NOTE — ED Provider Notes (Signed)
History     CSN: [id number]  Arrival date & time 05/07/12  1040   First MD Initiated Contact with Patient 05/07/12 1106      Chief Complaint  Patient presents with  . Abdominal Pain    (Consider location/radiation/quality/duration/timing/severity/associated sxs/prior treatment) Patient is a 27 y.o. female presenting with abdominal pain. The history is provided by the patient.  Abdominal Pain Pain location:  RUQ and RLQ Pain quality: aching and cramping   Pain radiates to:  Does not radiate Associated symptoms: nausea   Associated symptoms: no chest pain, no chills, no dysuria, no fever, no hematuria, no shortness of breath, no vaginal discharge and no vomiting   Associated symptoms comment:  RUQ abdominal pain that started early this morning around 5:00 a.m. The pain resolved and returned later in the morning and has remained constant. She has had nausea without vomiting. No fever. She denies vaginal discharge, dysuria, hematuria. No pleuritic pain, cough. No flank pain.   Past Medical History  Diagnosis Date  . Asthma   . IUD (intrauterine device) in place     Mirena    History reviewed. No pertinent past surgical history.  History reviewed. No pertinent family history.  History  Substance Use Topics  . Smoking status: Never Smoker   . Smokeless tobacco: Never Used  . Alcohol Use: Yes     Comment: ocassionally beer or wine    OB History   Grav Para Term Preterm Abortions TAB SAB Ect Mult Living                  Review of Systems  Constitutional: Negative for fever and chills.  HENT: Negative.   Respiratory: Negative.  Negative for shortness of breath.   Cardiovascular: Negative.  Negative for chest pain.  Gastrointestinal: Positive for nausea and abdominal pain. Negative for vomiting.  Genitourinary: Negative for dysuria, hematuria, vaginal discharge and pelvic pain.  Musculoskeletal: Negative.  Negative for back pain.  Skin: Negative.   Neurological:  Negative.   Psychiatric/Behavioral: Negative for confusion.    Allergies  Latex  Home Medications   Current Outpatient Rx  Name  Route  Sig  Dispense  Refill  . albuterol (PROVENTIL HFA;VENTOLIN HFA) 108 (90 BASE) MCG/ACT inhaler   Inhalation   Inhale 2 puffs into the lungs every 6 (six) hours as needed. For shortness of breath.         . fluticasone (FLONASE) 50 MCG/ACT nasal spray   Nasal   Place 2 sprays into the nose 2 (two) times daily as needed for rhinitis.         Marland Kitchen levonorgestrel (MIRENA) 20 MCG/24HR IUD   Intrauterine   1 each by Intrauterine route once.           BP 117/67  Pulse 105  Temp(Src) 98.2 F (36.8 C) (Oral)  Resp 20  SpO2 100%  Physical Exam  Constitutional: She is oriented to person, place, and time. She appears well-developed and well-nourished. No distress.  Neck: Normal range of motion.  Cardiovascular: Normal rate.   No murmur heard. Pulmonary/Chest: Effort normal. She has no wheezes. She has no rales.  Abdominal: Soft.  Tender to RUQ, RLQ and epigastrium. Pressure to suprapubic area increases right sided pain. No left abdominal tenderness.   Musculoskeletal: Normal range of motion.  Neurological: She is alert and oriented to person, place, and time.  Skin: Skin is warm and dry.  Psychiatric: She has a normal mood and affect.    ED  Course  Procedures (including critical care time)  Labs Reviewed  CBC WITH DIFFERENTIAL - Abnormal; Notable for the following:    Neutrophils Relative 84 (*)    Lymphocytes Relative 8 (*)    Lymphs Abs 0.4 (*)    All other components within normal limits  COMPREHENSIVE METABOLIC PANEL - Abnormal; Notable for the following:    Total Protein 8.4 (*)    All other components within normal limits  URINALYSIS, MICROSCOPIC ONLY - Abnormal; Notable for the following:    Specific Gravity, Urine 1.033 (*)    Hgb urine dipstick MODERATE (*)    Bilirubin Urine SMALL (*)    Ketones, ur 40 (*)    Bacteria,  UA FEW (*)    Squamous Epithelial / LPF MANY (*)    All other components within normal limits  LIPASE, BLOOD  POCT PREGNANCY, URINE   Results for orders placed during the hospital encounter of 05/07/12  CBC WITH DIFFERENTIAL      Result Value Range   WBC 5.2  4.0 - 10.5 K/uL   RBC 5.02  3.87 - 5.11 MIL/uL   Hemoglobin 14.7  12.0 - 15.0 g/dL   HCT 40.9  81.1 - 91.4 %   MCV 81.3  78.0 - 100.0 fL   MCH 29.3  26.0 - 34.0 pg   MCHC 36.0  30.0 - 36.0 g/dL   RDW 78.2  95.6 - 21.3 %   Platelets 268  150 - 400 K/uL   Neutrophils Relative 84 (*) 43 - 77 %   Neutro Abs 4.4  1.7 - 7.7 K/uL   Lymphocytes Relative 8 (*) 12 - 46 %   Lymphs Abs 0.4 (*) 0.7 - 4.0 K/uL   Monocytes Relative 7  3 - 12 %   Monocytes Absolute 0.4  0.1 - 1.0 K/uL   Eosinophils Relative 0  0 - 5 %   Eosinophils Absolute 0.0  0.0 - 0.7 K/uL   Basophils Relative 0  0 - 1 %   Basophils Absolute 0.0  0.0 - 0.1 K/uL  COMPREHENSIVE METABOLIC PANEL      Result Value Range   Sodium 135  135 - 145 mEq/L   Potassium 3.8  3.5 - 5.1 mEq/L   Chloride 104  96 - 112 mEq/L   CO2 24  19 - 32 mEq/L   Glucose, Bld 96  70 - 99 mg/dL   BUN 8  6 - 23 mg/dL   Creatinine, Ser 0.86  0.50 - 1.10 mg/dL   Calcium 9.1  8.4 - 57.8 mg/dL   Total Protein 8.4 (*) 6.0 - 8.3 g/dL   Albumin 4.1  3.5 - 5.2 g/dL   AST 17  0 - 37 U/L   ALT 9  0 - 35 U/L   Alkaline Phosphatase 75  39 - 117 U/L   Total Bilirubin 0.6  0.3 - 1.2 mg/dL   GFR calc non Af Amer >90  >90 mL/min   GFR calc Af Amer >90  >90 mL/min  LIPASE, BLOOD      Result Value Range   Lipase 27  11 - 59 U/L  URINALYSIS, MICROSCOPIC ONLY      Result Value Range   Color, Urine YELLOW  YELLOW   APPearance CLEAR  CLEAR   Specific Gravity, Urine 1.033 (*) 1.005 - 1.030   pH 5.0  5.0 - 8.0   Glucose, UA NEGATIVE  NEGATIVE mg/dL   Hgb urine dipstick MODERATE (*) NEGATIVE   Bilirubin  Urine SMALL (*) NEGATIVE   Ketones, ur 40 (*) NEGATIVE mg/dL   Protein, ur NEGATIVE  NEGATIVE mg/dL    Urobilinogen, UA 1.0  0.0 - 1.0 mg/dL   Nitrite NEGATIVE  NEGATIVE   Leukocytes, UA NEGATIVE  NEGATIVE   WBC, UA 0-2  <3 WBC/hpf   RBC / HPF 3-6  <3 RBC/hpf   Bacteria, UA FEW (*) RARE   Squamous Epithelial / LPF MANY (*) RARE   Urine-Other MUCOUS PRESENT    POCT PREGNANCY, URINE      Result Value Range   Preg Test, Ur NEGATIVE  NEGATIVE    US Abdomen Complete  05/07/2012  *RADIOLOGY REPORT*  Clinical Data:  Abdominal pain  COMPLETE ABDOMINAL ULTRASOUND  Comparison:  None  Findings:  Gallbladder: Well distended without wall thickening, stones or pericholecystic fluid. Negative sonographic Murphy's sign.  Common bile duct:   Normal in caliber without filling defects.  Liver:  Echogenicity is within normal limits.  No focal hepatic abnormalities are identified.  IVC:  Visualized portions appear unremarkable.  Pancreas:  Visualized portions appear unremarkable.  Spleen:  Visualized portions appear unremarkable.  Right Kidney:   The renal cortical thickness and echogenicity are preserved.  There is no hydronephrosis or focal abnormality. Renal length is 10.3 cm.  Left Kidney:   The renal cortical thickness and echogenicity are preserved.  There is no hydronephrosis or focal abnormality. Renal length is 10.9 cm.  Abdominal aorta:  Visualized portions appear unremarkable.  IMPRESSION: Unremarkable abdominal ultrasound.   Original Report Authenticated By: Carey Bullocks, M.D.    Ct Abdomen Pelvis W Contrast  05/07/2012  *RADIOLOGY REPORT*  Clinical Data: Diffuse abdominal pain radiating to the back.  Pain is worse on the right.  Nausea.  Symptoms for 2 days.  CT ABDOMEN AND PELVIS WITH CONTRAST  Technique:  Multidetector CT imaging of the abdomen and pelvis was performed following the standard protocol during bolus administration of intravenous contrast.  Contrast: OMNIPAQUE IOHEXOL 300 MG/ML  SOLN  Comparison: None.  Findings: The lung bases are clear.  No pleural or pericardial effusion.  The liver,  gallbladder, spleen, biliary tree, adrenal glands, kidneys and pancreas all appear normal.  There is a cystic lesion in the root of the mesentery measuring 3.0 x 4.1 cm in the axial plane on image 40 by 2.7 cm cranial-caudal on the coronal images on image 32.  The lesion is well-circumscribed there is no soft tissue component within it.  IUD is in place within the uterus.  Adnexa are unremarkable.  The stomach, small and large bowel and appendix all appear normal.  No bony abnormalities identified.  IMPRESSION:  1.  Negative for appendicitis.  No acute finding. 2.  Small cystic lesion in the root the mesentery has benign features and may represents a peritoneal or mesenteric inclusion cyst.   Original Report Authenticated By: Holley Dexter, M.D.    No results found.   No diagnosis found. 1. Abdominal pain   MDM  12:40:  Ultrasound abdomen ordered for further evaluation. Pain is improved with medications. Patient updated on plan.  Discussed ultrasound results as negative. Patient uncomfortable with discharge home and recheck in 24 hours. CT ordered and found to be negative. Patient's pain controlled. Stable for discharge.       Arnoldo Hooker, PA-C 05/12/12 2254

## 2012-05-07 NOTE — ED Notes (Signed)
Pt c/o right upper abd pain x 2 days with some radiation to lower side; pt denies N/V/D

## 2012-05-07 NOTE — ED Notes (Signed)
Patient states pain started night before last.  Patient claims she has thought she had to throw up a couple of times, but no emesis.  Patient claims she took miralax to have bm, but none.

## 2012-05-07 NOTE — ED Notes (Signed)
Patient ambulated to restroom and tolerated well.  

## 2012-05-07 NOTE — ED Notes (Signed)
CT notified of pt finishing CT contrast.  

## 2012-05-13 NOTE — ED Provider Notes (Signed)
Medical screening examination/treatment/procedure(s) were performed by non-physician practitioner and as supervising physician I was immediately available for consultation/collaboration.   Alysen Smylie, MD 05/13/12 1459 

## 2012-08-04 ENCOUNTER — Encounter (HOSPITAL_COMMUNITY): Payer: Self-pay

## 2012-08-04 ENCOUNTER — Emergency Department (HOSPITAL_COMMUNITY)
Admission: EM | Admit: 2012-08-04 | Discharge: 2012-08-04 | Disposition: A | Discharge status: Home / Self Care | Payer: Self-pay | Attending: Emergency Medicine | Admitting: Emergency Medicine

## 2012-08-04 DIAGNOSIS — R6884 Jaw pain: Secondary | ICD-10-CM | POA: Insufficient documentation

## 2012-08-04 DIAGNOSIS — Z91040 Latex allergy status: Secondary | ICD-10-CM | POA: Insufficient documentation

## 2012-08-04 DIAGNOSIS — J45909 Unspecified asthma, uncomplicated: Secondary | ICD-10-CM | POA: Insufficient documentation

## 2012-08-04 DIAGNOSIS — Z79899 Other long term (current) drug therapy: Secondary | ICD-10-CM | POA: Insufficient documentation

## 2012-08-04 DIAGNOSIS — R259 Unspecified abnormal involuntary movements: Secondary | ICD-10-CM | POA: Insufficient documentation

## 2012-08-04 MED ORDER — NAPROXEN 500 MG PO TABS: 500.0000 mg | ORAL_TABLET | Freq: Two times a day (BID) | ORAL | Status: DC

## 2012-08-04 MED ORDER — IBUPROFEN 800 MG PO TABS
800.0000 mg | ORAL_TABLET | Freq: Once | ORAL | Status: AC
  Administered 2012-08-04: 800 mg via ORAL
  Filled 2012-08-04: qty 1

## 2012-08-04 NOTE — ED Provider Notes (Signed)
Medical screening examination/treatment/procedure(s) were conducted as a shared visit with non-physician practitioner(s) and myself.  I personally evaluated the patient during the encounter  27 yo female with 1 week of right jaw pain.  Isolated on physical exam to right TMJ.  Mild trismus, but able to eat, no respiratory compromise.  No swelling at location of pain or elsewhere, not abscess or ludwig's angina.  No dental pain.  No trauma.  Scheduled NSAIDs and Facial follow up if no improvement.    Candyce Churn, MD 08/05/12 [phone]

## 2012-08-04 NOTE — ED Provider Notes (Signed)
History    CSN: [id number] Arrival date & time 08/04/12  1025  First MD Initiated Contact with Patient 08/04/12 1101     Chief Complaint  Patient presents with  . Jaw Pain   (Consider location/radiation/quality/duration/timing/severity/associated sxs/prior Treatment) HPI Comments: Patient is a 32 her old female with no significant past medical history who presents for right-sided jaw pain with onset one week ago. Patient states the pain is intermittent, but becoming more constant in the last day or so. She describes the pain as throbbing and sharp when attempting to open her mouth. She denies any radiation of the pain. Symptoms are aggravated with chewing and yawning. She has tried Tylenol without relief of symptoms. Patient denies associated fever, upper respiratory symptoms, dental pain, difficulty swallowing, dysphagia or drooling, ear pain or discharge, neck pain or stiffness, chest pain, and numbness or tingling. No hx of TMJ arthritis, jaw dislocation, or trauma.  The history is provided by the patient. No language interpreter was used.   Past Medical History  Diagnosis Date  . Asthma   . IUD (intrauterine device) in place     Mirena   History reviewed. No pertinent past surgical history. No family history on file. History  Substance Use Topics  . Smoking status: Never Smoker   . Smokeless tobacco: Never Used  . Alcohol Use: Yes     Comment: ocassionally beer or wine   OB History   Grav Para Term Preterm Abortions TAB SAB Ect Mult Living                 Review of Systems  Constitutional: Negative for fever.  HENT: Negative for ear pain, sore throat, drooling, trouble swallowing, dental problem, tinnitus and ear discharge.   Eyes: Negative for pain and visual disturbance.  Respiratory: Negative for shortness of breath.   Cardiovascular: Negative for chest pain.  Musculoskeletal: Positive for arthralgias (R jaw).  Neurological: Negative for weakness and numbness.    Allergies  Latex  Home Medications   Current Outpatient Rx  Name  Route  Sig  Dispense  Refill  . albuterol (PROVENTIL HFA;VENTOLIN HFA) 108 (90 BASE) MCG/ACT inhaler   Inhalation   Inhale 2 puffs into the lungs every 6 (six) hours as needed. For shortness of breath.         . fluticasone (FLONASE) 50 MCG/ACT nasal spray   Nasal   Place 2 sprays into the nose 2 (two) times daily as needed for rhinitis.         Marland Kitchen HYDROcodone-acetaminophen (NORCO/VICODIN) 5-325 MG per tablet   Oral   Take 1 tablet by mouth every 4 (four) hours as needed for pain.   6 tablet   0   . levonorgestrel (MIRENA) 20 MCG/24HR IUD   Intrauterine   1 each by Intrauterine route once.         . naproxen (NAPROSYN) 500 MG tablet   Oral   Take 1 tablet (500 mg total) by mouth 2 (two) times daily.   30 tablet   0   . ondansetron (ZOFRAN) 4 MG tablet   Oral   Take 1 tablet (4 mg total) by mouth every 6 (six) hours.   12 tablet   0    BP 116/68  Temp(Src) 99.4 F (37.4 C) (Oral)  Resp 20  SpO2 100%  Physical Exam  Nursing note and vitals reviewed. Constitutional: She is oriented to person, place, and time. She appears well-developed and well-nourished.  HENT:  Head: Normocephalic  and atraumatic. There is trismus in the jaw.  Right Ear: Tympanic membrane, external ear and ear canal normal. No mastoid tenderness.  Left Ear: Tympanic membrane, external ear and ear canal normal. No mastoid tenderness.  Nose: Nose normal.  Mouth/Throat: Uvula is midline, oropharynx is clear and moist and mucous membranes are normal. Normal dentition. No dental abscesses or edematous.  Eyes: Conjunctivae and EOM are normal. Pupils are equal, round, and reactive to light. No scleral icterus.  Neck: Normal range of motion. Neck supple.  Cardiovascular: Normal rate, regular rhythm and normal heart sounds.   Pulmonary/Chest: Effort normal and breath sounds normal. No stridor. No respiratory distress. She has no  wheezes. She has no rales.  Musculoskeletal: Normal range of motion.  Lymphadenopathy:    She has no cervical adenopathy.  Neurological: She is alert and oriented to person, place, and time.  Skin: Skin is warm and dry. No rash noted. No erythema. No pallor.  Psychiatric: She has a normal mood and affect. Her behavior is normal.   ED Course  Procedures (including critical care time) Labs Reviewed - No data to display No results found.  1. Jaw pain    MDM  Uncomplicated jaw pain - likely secondary to TMJ changes. Patient afebrile and hemodynamically stable. No jaw dislocation. No facial swelling, erythema, or heat-to-touch to suspect underlying infection. No signs concerning for Ludwig's angina. Airway patent and patient tolerating secretions without difficulty. Appropriate for d/c with maxillofacial follow up. Rx for naproxen given for pain control. Indications for ED return provided. Patient seen also by Dr. Loretha Stapler who is in agreement with this work up and management plan.  Antony Madura, PA-C 08/04/12 1157

## 2012-08-04 NOTE — ED Notes (Signed)
Pt c/o rt jaw pain x1wk, states thinks it is dislocated, states pain to yawn and her bite is off;no known injury, pt speaking in complete sentences

## 2012-08-05 NOTE — ED Provider Notes (Signed)
Medical screening examination/treatment/procedure(s) were conducted as a shared visit with non-physician practitioner(s) and myself.  I personally evaluated the patient during the encounter.   Please see my separate note.    Candyce Churn, MD 08/05/12 [phone]

## 2012-11-26 ENCOUNTER — Other Ambulatory Visit: Payer: Self-pay

## 2012-12-30 ENCOUNTER — Encounter (HOSPITAL_COMMUNITY): Payer: Self-pay | Admitting: Emergency Medicine

## 2012-12-30 ENCOUNTER — Emergency Department (HOSPITAL_COMMUNITY)
Admission: EM | Admit: 2012-12-30 | Discharge: 2012-12-30 | Disposition: A | Discharge status: Home / Self Care | Payer: 59 | Attending: Emergency Medicine | Admitting: Emergency Medicine

## 2012-12-30 DIAGNOSIS — N760 Acute vaginitis: Secondary | ICD-10-CM | POA: Insufficient documentation

## 2012-12-30 DIAGNOSIS — Z975 Presence of (intrauterine) contraceptive device: Secondary | ICD-10-CM | POA: Insufficient documentation

## 2012-12-30 DIAGNOSIS — Z91040 Latex allergy status: Secondary | ICD-10-CM | POA: Insufficient documentation

## 2012-12-30 DIAGNOSIS — R110 Nausea: Secondary | ICD-10-CM | POA: Insufficient documentation

## 2012-12-30 DIAGNOSIS — IMO0002 Reserved for concepts with insufficient information to code with codable children: Secondary | ICD-10-CM | POA: Insufficient documentation

## 2012-12-30 DIAGNOSIS — Z3202 Encounter for pregnancy test, result negative: Secondary | ICD-10-CM | POA: Insufficient documentation

## 2012-12-30 DIAGNOSIS — A499 Bacterial infection, unspecified: Secondary | ICD-10-CM | POA: Insufficient documentation

## 2012-12-30 DIAGNOSIS — Z791 Long term (current) use of non-steroidal anti-inflammatories (NSAID): Secondary | ICD-10-CM | POA: Insufficient documentation

## 2012-12-30 DIAGNOSIS — Z79899 Other long term (current) drug therapy: Secondary | ICD-10-CM | POA: Insufficient documentation

## 2012-12-30 DIAGNOSIS — J45909 Unspecified asthma, uncomplicated: Secondary | ICD-10-CM | POA: Insufficient documentation

## 2012-12-30 DIAGNOSIS — B9689 Other specified bacterial agents as the cause of diseases classified elsewhere: Secondary | ICD-10-CM | POA: Insufficient documentation

## 2012-12-30 LAB — URINALYSIS, ROUTINE W REFLEX MICROSCOPIC
Glucose, UA: NEGATIVE mg/dL
Hgb urine dipstick: NEGATIVE
Ketones, ur: NEGATIVE mg/dL
Protein, ur: NEGATIVE mg/dL
Urobilinogen, UA: 0.2 mg/dL (ref 0.0–1.0)

## 2012-12-30 LAB — WET PREP, GENITAL: Trich, Wet Prep: NONE SEEN

## 2012-12-30 LAB — POCT PREGNANCY, URINE: Preg Test, Ur: NEGATIVE

## 2012-12-30 MED ORDER — ONDANSETRON 4 MG PO TBDP: 4.0000 mg | ORAL_TABLET | Freq: Three times a day (TID) | ORAL | Status: DC | PRN

## 2012-12-30 MED ORDER — METRONIDAZOLE 500 MG PO TABS: 500.0000 mg | ORAL_TABLET | Freq: Two times a day (BID) | ORAL | Status: DC

## 2012-12-30 NOTE — ED Provider Notes (Signed)
Medical screening examination/treatment/procedure(s) were performed by non-physician practitioner and as supervising physician I was immediately available for consultation/collaboration.  EKG Interpretation   None       Devoria Albe, MD, Armando Gang   Ward Givens, MD 12/30/12 2253

## 2012-12-30 NOTE — ED Provider Notes (Signed)
CSN: [id number]     Arrival date & time 12/30/12  1630 History   First MD Initiated Contact with Patient 12/30/12 1641     Chief Complaint  Patient presents with  . Abdominal Pain   (Consider location/radiation/quality/duration/timing/severity/associated sxs/prior Treatment) HPI Comments: Patient presents today with a chief complaint of abdominal cramping and nausea.  She reports that her abdominal cramping has been occurring intermittently over the past week.  Cramping located in the central portion of the abdomen.  She denies any abdominal pain or cramping at this time.  She also reports that she has been nauseous intermittently over the past month.  She denies vomiting.  She reports that she had one day of vaginal spotting on 12-02-12, but no vaginal bleeding since that time.  She reports that she has also noticed an increase in vaginal discharge over the past week.  She reports that it is clear in color.  She currently has a Mirena IUD in place.  She states that the IUD was inserted in May of 2010.  She reports that she has taken two pregnancy test prior to arrival, which were both negative.  She also reports increased urinary frequency, but denies dysuria or urinary urgency.  She denies fever, chills, vomiting, diarrhea, or constipation.    The history is provided by the patient.    Past Medical History  Diagnosis Date  . Asthma   . IUD (intrauterine device) in place     Mirena   History reviewed. No pertinent past surgical history. No family history on file. History  Substance Use Topics  . Smoking status: Never Smoker   . Smokeless tobacco: Never Used  . Alcohol Use: Yes     Comment: ocassionally beer or wine   OB History   Grav Para Term Preterm Abortions TAB SAB Ect Mult Living                 Review of Systems  All other systems reviewed and are negative.    Allergies  Latex  Home Medications   Current Outpatient Rx  Name  Route  Sig  Dispense  Refill  .  albuterol (PROVENTIL HFA;VENTOLIN HFA) 108 (90 BASE) MCG/ACT inhaler   Inhalation   Inhale 2 puffs into the lungs every 6 (six) hours as needed. For shortness of breath.         . fluticasone (FLONASE) 50 MCG/ACT nasal spray   Nasal   Place 2 sprays into the nose 2 (two) times daily as needed for rhinitis.         Marland Kitchen HYDROcodone-acetaminophen (NORCO/VICODIN) 5-325 MG per tablet   Oral   Take 1 tablet by mouth every 4 (four) hours as needed for pain.   6 tablet   0   . levonorgestrel (MIRENA) 20 MCG/24HR IUD   Intrauterine   1 each by Intrauterine route once.         . naproxen (NAPROSYN) 500 MG tablet   Oral   Take 1 tablet (500 mg total) by mouth 2 (two) times daily.   30 tablet   0   . ondansetron (ZOFRAN) 4 MG tablet   Oral   Take 1 tablet (4 mg total) by mouth every 6 (six) hours.   12 tablet   0    BP 115/86  Pulse 86  Temp(Src) 97.9 F (36.6 C) (Oral)  Resp 20  SpO2 99% Physical Exam  Nursing note and vitals reviewed. Constitutional: She appears well-developed and well-nourished.  HENT:  Head: Normocephalic and atraumatic.  Mouth/Throat: Oropharynx is clear and moist.  Neck: Normal range of motion. Neck supple.  Cardiovascular: Normal rate, regular rhythm and normal heart sounds.   Pulmonary/Chest: Effort normal and breath sounds normal.  Abdominal: Soft. Bowel sounds are normal. She exhibits no distension and no mass. There is no tenderness. There is no rebound and no guarding.  Genitourinary: Cervix exhibits no motion tenderness. Right adnexum displays no mass, no tenderness and no fullness. Left adnexum displays no mass, no tenderness and no fullness.  Thick whitish colored discharge in the vaginal vault  Neurological: She is alert.  Skin: Skin is warm and dry.  Psychiatric: She has a normal mood and affect.    ED Course  Procedures (including critical care time) Labs Review Labs Reviewed  GC/CHLAMYDIA PROBE AMP  WET PREP, GENITAL  URINALYSIS,  ROUTINE W REFLEX MICROSCOPIC   Imaging Review No results found.  EKG Interpretation   None       MDM  No diagnosis found. Patient presenting with vaginal discharge and intermittent abdominal cramping.  No abdominal pain on exam.  No pain with pelvic exam.  Patient afebrile.  Urine pregnancy negative.  UA negative.  Wet prep showing BV.  Patient given prescription for Flagyl.  Patient instructed to not drink alcohol while on Flagyl.  GC/Chlamydia pending.  Patient refused prophylactic treatment of GC/Chlamydia.  Patient stable for discharge.  Return precautions given.    Santiago Glad, PA-C 12/30/12 1914

## 2012-12-30 NOTE — ED Notes (Addendum)
Pt c/o nausea x 1 month, has gained weight, had spotting last month and pt has Mirena. Nauseous all the time during the day. Pt also states she has been having cramping but none at the moment. Took two home pregnancy test that came out negative.

## 2012-12-31 LAB — GC/CHLAMYDIA PROBE AMP: CT Probe RNA: NEGATIVE

## 2013-01-04 ENCOUNTER — Telehealth: Payer: Self-pay | Admitting: Family Medicine

## 2013-01-04 NOTE — Telephone Encounter (Signed)
ER letter sent 

## 2013-05-07 ENCOUNTER — Encounter (HOSPITAL_COMMUNITY): Payer: Self-pay | Admitting: Emergency Medicine

## 2013-05-07 ENCOUNTER — Emergency Department (HOSPITAL_COMMUNITY)
Admission: EM | Admit: 2013-05-07 | Discharge: 2013-05-07 | Disposition: A | Discharge status: Home / Self Care | Payer: 59 | Attending: Emergency Medicine | Admitting: Emergency Medicine

## 2013-05-07 DIAGNOSIS — L0390 Cellulitis, unspecified: Secondary | ICD-10-CM

## 2013-05-07 DIAGNOSIS — J45909 Unspecified asthma, uncomplicated: Secondary | ICD-10-CM | POA: Insufficient documentation

## 2013-05-07 DIAGNOSIS — Z975 Presence of (intrauterine) contraceptive device: Secondary | ICD-10-CM | POA: Insufficient documentation

## 2013-05-07 DIAGNOSIS — R21 Rash and other nonspecific skin eruption: Secondary | ICD-10-CM | POA: Insufficient documentation

## 2013-05-07 DIAGNOSIS — Z91040 Latex allergy status: Secondary | ICD-10-CM | POA: Insufficient documentation

## 2013-05-07 DIAGNOSIS — Z79899 Other long term (current) drug therapy: Secondary | ICD-10-CM | POA: Insufficient documentation

## 2013-05-07 DIAGNOSIS — IMO0002 Reserved for concepts with insufficient information to code with codable children: Secondary | ICD-10-CM | POA: Insufficient documentation

## 2013-05-07 MED ORDER — SULFAMETHOXAZOLE-TRIMETHOPRIM 800-160 MG PO TABS: 1.0000 | ORAL_TABLET | Freq: Two times a day (BID) | ORAL | Status: DC

## 2013-05-07 MED ORDER — IBUPROFEN 800 MG PO TABS
800.0000 mg | ORAL_TABLET | Freq: Once | ORAL | Status: AC
  Administered 2013-05-07: 800 mg via ORAL
  Filled 2013-05-07: qty 1

## 2013-05-07 MED ORDER — CEPHALEXIN 500 MG PO CAPS: 500.0000 mg | ORAL_CAPSULE | Freq: Four times a day (QID) | ORAL | Status: DC

## 2013-05-07 MED ORDER — CEPHALEXIN 500 MG PO CAPS
500.0000 mg | ORAL_CAPSULE | Freq: Once | ORAL | Status: AC
  Administered 2013-05-07: 500 mg via ORAL
  Filled 2013-05-07: qty 1

## 2013-05-07 MED ORDER — IBUPROFEN 800 MG PO TABS: 800.0000 mg | ORAL_TABLET | Freq: Three times a day (TID) | ORAL | Status: DC

## 2013-05-07 MED ORDER — SULFAMETHOXAZOLE-TMP DS 800-160 MG PO TABS
1.0000 | ORAL_TABLET | Freq: Once | ORAL | Status: AC
  Administered 2013-05-07: 1 via ORAL
  Filled 2013-05-07: qty 1

## 2013-05-07 NOTE — ED Provider Notes (Signed)
CSN: [id number]     Arrival date & time 05/07/13  0755 History   First MD Initiated Contact with Patient 05/07/13 0805     Chief Complaint  Patient presents with  . Arm Swelling     (Consider location/radiation/quality/duration/timing/severity/associated sxs/prior Treatment) HPI Patient reports she was bitten by mosquitoes on her left arm 3 days ago. She developed itching at the site of the bite. There were initially 3 small bumps at the site of mosquito bites. The area at her left upper arm has since become painful and reddened. She treated herself with Benadryl and Tylenol. Benadryl helped itching however she continues to complain of pain at reddened area at left upper arm pain is moderate at present. No fever. No vomiting. No other associated symptoms. No trauma. Pain is nonradiating. Nothing makes symptoms better or worse. Last tetanus immunization 3 years ago Past Medical History  Diagnosis Date  . Asthma   . IUD (intrauterine device) in place     Mirena   History reviewed. No pertinent past surgical history. No family history on file. History  Substance Use Topics  . Smoking status: Never Smoker   . Smokeless tobacco: Never Used  . Alcohol Use: Yes     Comment: ocassionally beer or wine   OB History   Grav Para Term Preterm Abortions TAB SAB Ect Mult Living                 Review of Systems  Constitutional: Negative.   Respiratory: Negative.   Skin: Positive for rash.      Allergies  Bee venom and Latex  Home Medications   Prior to Admission medications   Medication Sig Start Date End Date Taking? Authorizing Provider  albuterol (PROVENTIL HFA;VENTOLIN HFA) 108 (90 BASE) MCG/ACT inhaler Inhale 2 puffs into the lungs every 6 (six) hours as needed. For shortness of breath.   Yes Historical Provider, MD  diphenhydrAMINE (BENADRYL) 2 % cream Apply 1 application topically 2 (two) times daily as needed for itching (and swelling on arm).   Yes Historical Provider, MD   diphenhydrAMINE (BENADRYL) 25 mg capsule Take 25 mg by mouth every 6 (six) hours as needed for allergies.   Yes Historical Provider, MD  fluticasone (FLONASE) 50 MCG/ACT nasal spray Place 2 sprays into the nose 2 (two) times daily as needed for rhinitis.   Yes Historical Provider, MD  levonorgestrel (MIRENA) 20 MCG/24HR IUD 1 each by Intrauterine route once.   Yes Historical Provider, MD   There were no vitals taken for this visit. Physical Exam  Nursing note and vitals reviewed. Constitutional: She is oriented to person, place, and time. She appears well-developed and well-nourished. No distress.  HENT:  Head: Normocephalic and atraumatic.  Right Ear: External ear normal.  Left Ear: External ear normal.  Mouth/Throat: Oropharynx is clear and moist.  Eyes: Pupils are equal, round, and reactive to light.  Neck: Normal range of motion. Neck supple.  Cardiovascular: Regular rhythm.   No murmur heard. Pulmonary/Chest: Breath sounds normal.  Abdominal: Soft. She exhibits no distension. There is no tenderness.  Musculoskeletal: Normal range of motion. She exhibits tenderness. She exhibits no edema.  Left upper extremity with 3 tiny papules at posterior upper arm with surrounding grapefruit-sized area which is reddened and minimally tender. No swelling or fluctuance. No rigidity. No axillary nodes. For range of motion. Pulses 2+. All other extremities no redness swelling or tenderness neurovascularly intact  Neurological: She is alert and oriented to person, place, and  time.  Skin: Skin is warm and dry. There is erythema.  Erythematous at left upper arm posterior aspect. No other rash or wound    ED Course  Procedures (including critical care time) Labs Review Labs Reviewed - No data to display  Imaging Review No results found.   EKG Interpretation None      MDM   Final diagnoses:  None   Patients develop a cellulitis is resolving to light. Plan prescriptions ibuprofen,    SEPTRA DS, Keflex. Elevation, warm soaks. [facility] if not better in a few days diagnosis cellulitis of left arm    Doug SouSam Abundio Teuscher, MD 05/07/13 [phone]

## 2013-05-07 NOTE — ED Notes (Signed)
Pt presents with  C/o left arm swelling after insect bite. Sts it started on Wednesday, had some drainage for about a day. Reports pain 8/10.

## 2013-05-07 NOTE — Discharge Instructions (Signed)
Cellulitis See [facility] if not improving in 3 or 4 days. Take Tylenol for mild pain or the ibuprofen prescribed for bad pain Cellulitis is an infection of the skin and the tissue under the skin. The infected area is usually red and tender. This happens most often in the arms and lower legs. HOME CARE   Take your antibiotic medicine as told. Finish the medicine even if you start to feel better.  Keep the infected arm or leg raised (elevated).  Put a warm cloth on the area up to 4 times per day.  Only take medicines as told by your doctor.  Keep all doctor visits as told. GET HELP RIGHT AWAY IF:   You have a fever.  You feel very sleepy.  You throw up (vomit) or have watery poop (diarrhea).  You feel sick and have muscle aches and pains.  You see red streaks on the skin coming from the infected area.  Your red area gets bigger or turns a dark color.  Your bone or joint under the infected area is painful after the skin heals.  Your infection comes back in the same area or different area.  You have a puffy (swollen) bump in the infected area.  You have new symptoms. MAKE SURE YOU:   Understand these instructions.  Will watch your condition.  Will get help right away if you are not doing well or get worse. Document Released: 06/26/2007 Document Revised: 07/09/2011 Document Reviewed: 03/25/2011 [facility] Patient Information 2014 [employer] [place].

## 2013-06-07 ENCOUNTER — Encounter (HOSPITAL_COMMUNITY): Payer: Self-pay | Admitting: Emergency Medicine

## 2013-06-07 ENCOUNTER — Emergency Department (HOSPITAL_COMMUNITY)
Admission: EM | Admit: 2013-06-07 | Discharge: 2013-06-07 | Disposition: A | Discharge status: Home / Self Care | Payer: 59 | Attending: Emergency Medicine | Admitting: Emergency Medicine

## 2013-06-07 DIAGNOSIS — R110 Nausea: Secondary | ICD-10-CM | POA: Insufficient documentation

## 2013-06-07 DIAGNOSIS — IMO0002 Reserved for concepts with insufficient information to code with codable children: Secondary | ICD-10-CM | POA: Insufficient documentation

## 2013-06-07 DIAGNOSIS — Z79899 Other long term (current) drug therapy: Secondary | ICD-10-CM | POA: Insufficient documentation

## 2013-06-07 DIAGNOSIS — Z791 Long term (current) use of non-steroidal anti-inflammatories (NSAID): Secondary | ICD-10-CM | POA: Insufficient documentation

## 2013-06-07 DIAGNOSIS — Z3202 Encounter for pregnancy test, result negative: Secondary | ICD-10-CM | POA: Insufficient documentation

## 2013-06-07 DIAGNOSIS — Z975 Presence of (intrauterine) contraceptive device: Secondary | ICD-10-CM | POA: Insufficient documentation

## 2013-06-07 DIAGNOSIS — R109 Unspecified abdominal pain: Secondary | ICD-10-CM | POA: Insufficient documentation

## 2013-06-07 DIAGNOSIS — Z91040 Latex allergy status: Secondary | ICD-10-CM | POA: Insufficient documentation

## 2013-06-07 DIAGNOSIS — J45909 Unspecified asthma, uncomplicated: Secondary | ICD-10-CM | POA: Insufficient documentation

## 2013-06-07 LAB — COMPREHENSIVE METABOLIC PANEL
ALT: 12 U/L (ref 0–35)
AST: 21 U/L (ref 0–37)
Albumin: 4 g/dL (ref 3.5–5.2)
Alkaline Phosphatase: 71 U/L (ref 39–117)
BUN: 6 mg/dL (ref 6–23)
CO2: 24 mEq/L (ref 19–32)
Calcium: 9.2 mg/dL (ref 8.4–10.5)
Chloride: 101 mEq/L (ref 96–112)
Creatinine, Ser: 0.79 mg/dL (ref 0.50–1.10)
GFR calc Af Amer: >90 mL/min (ref >90)
GFR calc non Af Amer: >90 mL/min (ref >90)
Glucose, Bld: 83 mg/dL (ref 70–99)
Potassium: 3.7 mEq/L (ref 3.7–5.3)
Sodium: 137 mEq/L (ref 137–147)
Total Bilirubin: 0.4 mg/dL (ref 0.3–1.2)
Total Protein: 8.2 g/dL (ref 6.0–8.3)

## 2013-06-07 LAB — CBC WITH DIFFERENTIAL/PLATELET
Basophils Absolute: 0 10*3/uL (ref 0.0–0.1)
Basophils Relative: 0 % (ref 0–1)
Eosinophils Absolute: 0.1 10*3/uL (ref 0.0–0.7)
Eosinophils Relative: 1 % (ref 0–5)
HCT: 37.7 % (ref 36.0–46.0)
Hemoglobin: 13.1 g/dL (ref 12.0–15.0)
Lymphocytes Relative: 36 % (ref 12–46)
Lymphs Abs: 2 10*3/uL (ref 0.7–4.0)
MCH: 28.6 pg (ref 26.0–34.0)
MCHC: 34.7 g/dL (ref 30.0–36.0)
MCV: 82.3 fL (ref 78.0–100.0)
Monocytes Absolute: 0.7 10*3/uL (ref 0.1–1.0)
Monocytes Relative: 12 % (ref 3–12)
Neutro Abs: 2.8 10*3/uL (ref 1.7–7.7)
Neutrophils Relative %: 51 % (ref 43–77)
Platelets: 346 10*3/uL (ref 150–400)
RBC: 4.58 MIL/uL (ref 3.87–5.11)
RDW: 12.5 % (ref 11.5–15.5)
WBC: 5.6 10*3/uL (ref 4.0–10.5)

## 2013-06-07 LAB — URINALYSIS, ROUTINE W REFLEX MICROSCOPIC
Bilirubin Urine: NEGATIVE
Glucose, UA: NEGATIVE mg/dL
Ketones, ur: NEGATIVE mg/dL
Leukocytes, UA: NEGATIVE
Nitrite: NEGATIVE
Protein, ur: NEGATIVE mg/dL
Specific Gravity, Urine: 1.01 (ref 1.005–1.030)
Urobilinogen, UA: 0.2 mg/dL (ref 0.0–1.0)
pH: 5.5 (ref 5.0–8.0)

## 2013-06-07 LAB — WET PREP, GENITAL
Clue Cells Wet Prep HPF POC: NONE SEEN
Trich, Wet Prep: NONE SEEN
Yeast Wet Prep HPF POC: NONE SEEN

## 2013-06-07 LAB — URINE MICROSCOPIC-ADD ON

## 2013-06-07 LAB — POC URINE PREG, ED: Preg Test, Ur: NEGATIVE

## 2013-06-07 NOTE — Discharge Instructions (Signed)
Follow up with [facility] for further evaluation of your abdominal pain. Refer to attached documents for more information. Return to the ED with worsening or concerning symptoms.

## 2013-06-07 NOTE — Progress Notes (Signed)
[facility] provided pt with a list of primary care resources. Patient stated pending insurance through job.  °

## 2013-06-07 NOTE — ED Provider Notes (Signed)
CSN: [id number]     Arrival date & time 06/07/13  0757 History   First MD Initiated Contact with Patient 06/07/13 [phone]     Chief Complaint  Patient presents with  . Abdominal Pain  . Nausea     (Consider location/radiation/quality/duration/timing/severity/associated sxs/prior Treatment) HPI Comments: Patient is a 28 year old female who presents with abdominal pain that started last night. The pain is located in her lower abdomen and does not radiate. The pain is described as cramping and moderate. The pain started gradually and progressively worsened since the onset. No alleviating/aggravating factors. The patient has tried nothing for symptoms without relief. Associated symptoms include nothing. Patient reports having an IUD that is over 618 years old and thinks that is causing her pain. Patient denies fever, headache, NVD, chest pain, SOB, dysuria, constipation, abnormal vaginal bleeding/discharge.      Past Medical History  Diagnosis Date  . Asthma   . IUD (intrauterine device) in place     Mirena   History reviewed. No pertinent past surgical history. Family History  Problem Relation Age of Onset  . Stroke Mother   . Hypertension Father   . Diabetes Father    History  Substance Use Topics  . Smoking status: Never Smoker   . Smokeless tobacco: Never Used  . Alcohol Use: Yes     Comment: ocassionally beer or wine   OB History   Grav Para Term Preterm Abortions TAB SAB Ect Mult Living                 Review of Systems  Constitutional: Negative for fever, chills and fatigue.  HENT: Negative for trouble swallowing.   Eyes: Negative for visual disturbance.  Respiratory: Negative for shortness of breath.   Cardiovascular: Negative for chest pain and palpitations.  Gastrointestinal: Negative for nausea, vomiting, abdominal pain and diarrhea.  Genitourinary: Positive for pelvic pain. Negative for dysuria and difficulty urinating.  Musculoskeletal: Negative for arthralgias  and neck pain.  Skin: Negative for color change.  Neurological: Negative for dizziness and weakness.  Psychiatric/Behavioral: Negative for dysphoric mood.      Allergies  Bee venom and Latex  Home Medications   Prior to Admission medications   Medication Sig Start Date End Date Taking? Authorizing Provider  albuterol (PROVENTIL HFA;VENTOLIN HFA) 108 (90 BASE) MCG/ACT inhaler Inhale 2 puffs into the lungs every 6 (six) hours as needed. For shortness of breath.   Yes Historical Provider, MD  fluticasone (FLONASE) 50 MCG/ACT nasal spray Place 2 sprays into the nose 2 (two) times daily as needed for rhinitis.   Yes Historical Provider, MD  ibuprofen (ADVIL,MOTRIN) 800 MG tablet Take 1 tablet (800 mg total) by mouth 3 (three) times daily. 05/07/13  Yes Doug SouSam Jacubowitz, MD  levonorgestrel (MIRENA) 20 MCG/24HR IUD 1 each by Intrauterine route once.   Yes Historical Provider, MD   BP 130/74  Pulse 95  Temp(Src) 98 F (36.7 C) (Oral)  Resp 18  Ht 5\' 8"  (1.727 m)  Wt 185 lb (83.915 kg)  BMI 28.14 kg/m2  SpO2 100% Physical Exam  Nursing note and vitals reviewed. Constitutional: She is oriented to person, place, and time. She appears well-developed and well-nourished. No distress.  HENT:  Head: Normocephalic and atraumatic.  Eyes: Conjunctivae and EOM are normal.  Neck: Normal range of motion.  Cardiovascular: Normal rate and regular rhythm.  Exam reveals no gallop and no friction rub.   No murmur heard. Pulmonary/Chest: Effort normal and breath sounds normal. She  has no wheezes. She has no rales. She exhibits no tenderness.  Abdominal: Soft. She exhibits no distension. There is no tenderness. There is no rebound and no guarding.  Genitourinary: Vagina normal.  Clitoral piercing noted. Normal external genitalia. Moderate amount of gray discharge noted in vaginal canal. No CMT. Cervical os closed. IUD strings noted. Generalized tenderness to palpation on bimanual exam. No masses palpated.    Musculoskeletal: Normal range of motion.  Neurological: She is alert and oriented to person, place, and time. Coordination normal.  Speech is goal-oriented. Moves limbs without ataxia.   Skin: Skin is warm and dry.  Psychiatric: She has a normal mood and affect. Her behavior is normal.    ED Course  Procedures (including critical care time) Labs Review Labs Reviewed  WET PREP, GENITAL - Abnormal; Notable for the following:    WBC, Wet Prep HPF POC MANY (*)    All other components within normal limits  URINALYSIS, ROUTINE W REFLEX MICROSCOPIC - Abnormal; Notable for the following:    Hgb urine dipstick TRACE (*)    All other components within normal limits  GC/CHLAMYDIA PROBE AMP  CBC WITH DIFFERENTIAL  COMPREHENSIVE METABOLIC PANEL  URINE MICROSCOPIC-ADD ON  POC URINE PREG, ED    Imaging Review No results found.   EKG Interpretation None      MDM   Final diagnoses:  Abdominal pain    9:05 AM Labs, urine preg, urinalysis, wet prep and GC/Chlamydia pending. Vitals stable and patient afebrile.   10:33 AM Labs and urinalysis unremarkable for acute changes. Wet prep shows WBCs. Patient's GC/Chlamydia is pending. Patient will be contacted for treatment if results are positive. [facility] for [facility] evaluation. Vitals stable and patient afebrile. Patient is resting comfortably.    Emilia BeckKaitlyn Aarya Robinson, PA-C 06/07/13 1037

## 2013-06-07 NOTE — ED Notes (Addendum)
Pt reports nausea over past few weeks with new onset of lower abdominal pain starting last night. Pt reports normal BM last night. Pt denies blood in stool. Pt denies abnormal vaginal/urinary symptoms but reports IUD expires NOW.

## 2013-06-07 NOTE — ED Notes (Signed)
Patient reports lower abdominal cramping with nausea. Patient unsure if she is having vaginal discharge. Patient states she does have an IUD that is 28 years old and should have been taken out by now. Paatient denies any dysuria or frequency.

## 2013-06-07 NOTE — ED Provider Notes (Signed)
Medical screening examination/treatment/procedure(s) were performed by non-physician practitioner and as supervising physician I was immediately available for consultation/collaboration.   EKG Interpretation None       Raeford RazorStephen Nadelyn Enriques, MD 06/07/13 1041

## 2013-06-08 LAB — GC/CHLAMYDIA PROBE AMP
CT Probe RNA: NEGATIVE
GC Probe RNA: NEGATIVE

## 2013-06-28 ENCOUNTER — Encounter: Payer: Self-pay | Admitting: Obstetrics & Gynecology

## 2013-06-28 ENCOUNTER — Ambulatory Visit (INDEPENDENT_AMBULATORY_CARE_PROVIDER_SITE_OTHER): Payer: 59 | Admitting: Obstetrics & Gynecology

## 2013-06-28 VITALS — BP 116/77 | HR 98 | Temp 99.1°F | Wt 185.7 lb

## 2013-06-28 DIAGNOSIS — N949 Unspecified condition associated with female genital organs and menstrual cycle: Secondary | ICD-10-CM

## 2013-06-28 DIAGNOSIS — R102 Pelvic and perineal pain: Secondary | ICD-10-CM

## 2013-06-28 NOTE — Progress Notes (Signed)
Patient here today to have IUD removed-- was placed Jun 09, 2008. Patient does not have insurance but states it is supposed to kick in Friday 07/02/13. Informed patient that it would be a $120 removal fee today, however, we can get her an appointment for Monday 07/05/13 at 1400 for IUD removal and her insurance will cover it if it has kicked in. Patient verbalizes understanding and states she would like to wait and remove IUD at appointment on Monday 07/05/13. Discussed with patient other birth control options (pills, mirena, skyla, nexplanon) and patient is to consider these options and discuss with provider at next visit. Informed patient that if her insurance still is not working she will be responsible for $120 fee and we could fill out a Mirena or Skyla free application and see if she is a candidate. Patient verbalizes understanding and gratitude. NO further questions or concerns.

## 2013-07-05 ENCOUNTER — Ambulatory Visit: Payer: Self-pay | Admitting: Obstetrics & Gynecology

## 2013-07-05 ENCOUNTER — Telehealth: Payer: Self-pay | Admitting: *Deleted

## 2013-07-05 NOTE — Telephone Encounter (Signed)
Attempted to call patient, no answer, left message for patient to call clinic and reschedule appointment.  

## 2013-07-08 ENCOUNTER — Inpatient Hospital Stay (HOSPITAL_COMMUNITY)
Admission: AD | Admit: 2013-07-08 | Discharge: 2013-07-08 | Disposition: A | Discharge status: Home / Self Care | Payer: Self-pay | Source: Ambulatory Visit | Attending: Obstetrics & Gynecology | Admitting: Obstetrics & Gynecology

## 2013-07-08 ENCOUNTER — Encounter (HOSPITAL_COMMUNITY): Payer: Self-pay | Admitting: General Practice

## 2013-07-08 DIAGNOSIS — N949 Unspecified condition associated with female genital organs and menstrual cycle: Secondary | ICD-10-CM | POA: Insufficient documentation

## 2013-07-08 DIAGNOSIS — T8389XA Other specified complication of genitourinary prosthetic devices, implants and grafts, initial encounter: Secondary | ICD-10-CM

## 2013-07-08 DIAGNOSIS — T839XXA Unspecified complication of genitourinary prosthetic device, implant and graft, initial encounter: Secondary | ICD-10-CM

## 2013-07-08 DIAGNOSIS — R102 Pelvic and perineal pain: Secondary | ICD-10-CM

## 2013-07-08 LAB — URINE MICROSCOPIC-ADD ON

## 2013-07-08 LAB — URINALYSIS, ROUTINE W REFLEX MICROSCOPIC
Bilirubin Urine: NEGATIVE
Glucose, UA: NEGATIVE mg/dL
KETONES UR: NEGATIVE mg/dL
Nitrite: NEGATIVE
PROTEIN: NEGATIVE mg/dL
Specific Gravity, Urine: 1.025 (ref 1.005–1.030)
Urobilinogen, UA: 1 mg/dL (ref 0.0–1.0)
pH: 6 (ref 5.0–8.0)

## 2013-07-08 LAB — WET PREP, GENITAL
CLUE CELLS WET PREP: NONE SEEN
Trich, Wet Prep: NONE SEEN
YEAST WET PREP: NONE SEEN

## 2013-07-08 LAB — POCT PREGNANCY, URINE: PREG TEST UR: NEGATIVE

## 2013-07-08 NOTE — MAU Provider Note (Signed)
History     CSN: [id number]  Arrival date and time: 07/08/13 1716   First Provider Initiated Contact with Patient 07/08/13 1903      Chief Complaint  Patient presents with  . Pelvic Pain   HPI Katie Riggs  28 y.o.  presents with lower abdominal cramping rated 9/10 that has been present since May. Has had her Mirena IUD present since 2010. [facility] which was negative. States she believes that cramping is from her Mirena IUD and wishes it removed. Had an appointment for removal on Monday but was unable to make it due to daughter being sick. Plans to possibly get another IUD but states she understands she will have no protection from pregnancy once removed.  OB History   Grav Para Term Preterm Abortions TAB SAB Ect Mult Living   1 1 1  0 0 0 0 0 0 1      Past Medical History  Diagnosis Date  . Asthma   . IUD (intrauterine device) in place     Mirena    History reviewed. No pertinent past surgical history.  Family History  Problem Relation Age of Onset  . Stroke Mother   . Hypertension Father   . Diabetes Father     History  Substance Use Topics  . Smoking status: Never Smoker   . Smokeless tobacco: Never Used  . Alcohol Use: Yes     Comment: ocassionally beer or wine    Allergies:  Allergies  Allergen Reactions  . Bee Venom Anaphylaxis  . Latex Anaphylaxis and Rash    Prescriptions prior to admission  Medication Sig Dispense Refill  . acetaminophen (TYLENOL) 500 MG tablet Take 1,000 mg by mouth every 6 (six) hours as needed for headache.      . cephALEXin (KEFLEX) 500 MG capsule Take 500 mg by mouth 2 (two) times daily.      . diphenhydrAMINE (BENADRYL) 25 MG tablet Take 25-50 mg by mouth every 6 (six) hours as needed for allergies.      Marland Kitchen. EPINEPHrine (EPIPEN IJ) Inject as directed once.      . sulfamethoxazole-trimethoprim (BACTRIM DS) 800-160 MG per tablet Take 1 tablet by mouth daily.      Marland Kitchen. albuterol (PROVENTIL  HFA;VENTOLIN HFA) 108 (90 BASE) MCG/ACT inhaler Inhale 2 puffs into the lungs every 6 (six) hours as needed. For shortness of breath.      . levonorgestrel (MIRENA) 20 MCG/24HR IUD 1 each by Intrauterine route once.        Review of Systems  Constitutional: Negative for fever.  Respiratory: Negative.   Cardiovascular: Negative.   Gastrointestinal: Positive for abdominal pain. Negative for nausea.  Genitourinary: Negative for dysuria, urgency, frequency and hematuria.  Musculoskeletal: Negative.   Neurological: Negative.   Psychiatric/Behavioral: Negative.    Physical Exam   Blood pressure 113/75, pulse 90, temperature 98.3 F (36.8 C), temperature source Oral, resp. rate 18, height 5\' 8"  (1.727 m), weight 84.823 kg (187 lb).  Physical Exam  Nursing note and vitals reviewed. Constitutional: She is oriented to person, place, and time. She appears well-developed and well-nourished. No distress.  HENT:  Head: Normocephalic and atraumatic.  Eyes: Pupils are equal, round, and reactive to light.  Neck: Normal range of motion.  Cardiovascular: Normal rate.   Respiratory: Effort normal.  GI: Soft.  Genitourinary:  External: Negative for excoriations and lesions. Piercing present. Vaginal: Pink, no bleeding noted. White discharge present.  Cervix: Closed,  thick. No CMT. IUD strings visualized.  Bimanual: No uterine and adnexal tenderness or masses   Musculoskeletal: Normal range of motion.  Neurological: She is alert and oriented to person, place, and time.  Skin: Skin is warm and dry.  Psychiatric: She has a normal mood and affect. Her behavior is normal. Judgment and thought content normal.   Results for orders placed during the hospital encounter of 07/08/13 (from the past 48 hour(s))  URINALYSIS, ROUTINE W REFLEX MICROSCOPIC     Status: Abnormal   Collection Time    07/08/13  5:20 PM      Result Value Ref Range   Color, Urine YELLOW  YELLOW   APPearance CLEAR  CLEAR   Specific  Gravity, Urine 1.025  1.005 - 1.030   pH 6.0  5.0 - 8.0   Glucose, UA NEGATIVE  NEGATIVE mg/dL   Hgb urine dipstick SMALL (*) NEGATIVE   Bilirubin Urine NEGATIVE  NEGATIVE   Ketones, ur NEGATIVE  NEGATIVE mg/dL   Protein, ur NEGATIVE  NEGATIVE mg/dL   Urobilinogen, UA 1.0  0.0 - 1.0 mg/dL   Nitrite NEGATIVE  NEGATIVE   Leukocytes, UA SMALL (*) NEGATIVE  URINE MICROSCOPIC-ADD ON     Status: Abnormal   Collection Time    07/08/13  5:20 PM      Result Value Ref Range   Squamous Epithelial / LPF FEW (*) RARE   WBC, UA 7-10  <3 WBC/hpf   RBC / HPF 3-6  <3 RBC/hpf   Bacteria, UA MANY (*) RARE   Urine-Other MUCOUS PRESENT    POCT PREGNANCY, URINE     Status: None   Collection Time    07/08/13  5:44 PM      Result Value Ref Range   Preg Test, Ur NEGATIVE  NEGATIVE   Comment:            THE SENSITIVITY OF THIS     METHODOLOGY IS >24 mIU/mL  WET PREP, GENITAL     Status: Abnormal   Collection Time    07/08/13  7:15 PM      Result Value Ref Range   Yeast Wet Prep HPF POC NONE SEEN  NONE SEEN   Trich, Wet Prep NONE SEEN  NONE SEEN   Clue Cells Wet Prep HPF POC NONE SEEN  NONE SEEN   WBC, Wet Prep HPF POC FEW (*) NONE SEEN   Comment: MODERATE BACTERIA SEEN   MAU Course  Procedures  MDM UA, Wet Prep Removed IUD with ring forceps without complications  Assessment and Plan  A: IUD complication and removal  P: Discharge home in stable condition [facility] or [facility] for future contraceptive management  Return to MAU as needed for increased pain, vaginal bleeding, nausea/vomiting or fever    Sauve, Lauren L 07/08/2013, 7:38 PM   Seen and examined by me also Agree with note Aviva SignsMarie L Williams, CNM

## 2013-07-08 NOTE — Discharge Instructions (Signed)
Contraception Choices Contraception (birth control) is the use of any methods or devices to prevent pregnancy. Below are some methods to help avoid pregnancy. HORMONAL METHODS   Contraceptive implant. This is a thin, plastic tube containing progesterone hormone. It does not contain estrogen hormone. Your health care provider inserts the tube in the inner part of the upper arm. The tube can remain in place for up to 3 years. After 3 years, the implant must be removed. The implant prevents the ovaries from releasing an egg (ovulation), thickens the cervical mucus to prevent sperm from entering the uterus, and thins the lining of the inside of the uterus.  Progesterone-only injections. These injections are given every 3 months by your health care provider to prevent pregnancy. This synthetic progesterone hormone stops the ovaries from releasing eggs. It also thickens cervical mucus and changes the uterine lining. This makes it harder for sperm to survive in the uterus.  Birth control pills. These pills contain estrogen and progesterone hormone. They work by preventing the ovaries from releasing eggs (ovulation). They also cause the cervical mucus to thicken, preventing the sperm from entering the uterus. Birth control pills are prescribed by a health care provider.Birth control pills can also be used to treat heavy periods.  Minipill. This type of birth control pill contains only the progesterone hormone. They are taken every day of each month and must be prescribed by your health care provider.  Birth control patch. The patch contains hormones similar to those in birth control pills. It must be changed once a week and is prescribed by a health care provider.  Vaginal ring. The ring contains hormones similar to those in birth control pills. It is left in the vagina for 3 weeks, removed for 1 week, and then a new one is put back in place. The patient must be comfortable inserting and removing the ring  from the vagina.A health care provider's prescription is necessary.  Emergency contraception. Emergency contraceptives prevent pregnancy after unprotected sexual intercourse. This pill can be taken right after sex or up to 5 days after unprotected sex. It is most effective the sooner you take the pills after having sexual intercourse. Most emergency contraceptive pills are available without a prescription. Check with your pharmacist. Do not use emergency contraception as your only form of birth control. BARRIER METHODS   Female condom. This is a thin sheath (latex or rubber) that is worn over the penis during sexual intercourse. It can be used with spermicide to increase effectiveness.  Female condom. This is a soft, loose-fitting sheath that is put into the vagina before sexual intercourse.  Diaphragm. This is a soft, latex, dome-shaped barrier that must be fitted by a health care provider. It is inserted into the vagina, along with a spermicidal jelly. It is inserted before intercourse. The diaphragm should be left in the vagina for 6 to 8 hours after intercourse.  Cervical cap. This is a round, soft, latex or plastic cup that fits over the cervix and must be fitted by a health care provider. The cap can be left in place for up to 48 hours after intercourse.  Sponge. This is a soft, circular piece of polyurethane foam. The sponge has spermicide in it. It is inserted into the vagina after wetting it and before sexual intercourse.  Spermicides. These are chemicals that kill or block sperm from entering the cervix and uterus. They come in the form of creams, jellies, suppositories, foam, or tablets. They do not require a   prescription. They are inserted into the vagina with an applicator before having sexual intercourse. The process must be repeated every time you have sexual intercourse. INTRAUTERINE CONTRACEPTION  Intrauterine device (IUD). This is a T-shaped device that is put in a woman's uterus  during a menstrual period to prevent pregnancy. There are 2 types:  Copper IUD. This type of IUD is wrapped in copper wire and is placed inside the uterus. Copper makes the uterus and fallopian tubes produce a fluid that kills sperm. It can stay in place for 10 years.  Hormone IUD. This type of IUD contains the hormone progestin (synthetic progesterone). The hormone thickens the cervical mucus and prevents sperm from entering the uterus, and it also thins the uterine lining to prevent implantation of a fertilized egg. The hormone can weaken or kill the sperm that get into the uterus. It can stay in place for 3-5 years, depending on which type of IUD is used. PERMANENT METHODS OF CONTRACEPTION  Female tubal ligation. This is when the woman's fallopian tubes are surgically sealed, tied, or blocked to prevent the egg from traveling to the uterus.  Hysteroscopic sterilization. This involves placing a small coil or insert into each fallopian tube. Your doctor uses a technique called hysteroscopy to do the procedure. The device causes scar tissue to form. This results in permanent blockage of the fallopian tubes, so the sperm cannot fertilize the egg. It takes about 3 months after the procedure for the tubes to become blocked. You must use another form of birth control for these 3 months.  Female sterilization. This is when the female has the tubes that carry sperm tied off (vasectomy).This blocks sperm from entering the vagina during sexual intercourse. After the procedure, the man can still ejaculate fluid (semen). NATURAL PLANNING METHODS  Natural family planning. This is not having sexual intercourse or using a barrier method (condom, diaphragm, cervical cap) on days the woman could become pregnant.  Calendar method. This is keeping track of the length of each menstrual cycle and identifying when you are fertile.  Ovulation method. This is avoiding sexual intercourse during ovulation.  Symptothermal  method. This is avoiding sexual intercourse during ovulation, using a thermometer and ovulation symptoms.  Post-ovulation method. This is timing sexual intercourse after you have ovulated. Regardless of which type or method of contraception you choose, it is important that you use condoms to protect against the transmission of sexually transmitted infections (STIs). Talk with your health care provider about which form of contraception is most appropriate for you. Document Released: 01/07/2005 Document Revised: 01/12/2013 Document Reviewed: 07/02/2012 [employer] Patient Information 2015 [employer]. This information is not intended to replace advice given to you by your health care provider. Make sure you discuss any questions you have with your health care provider.  

## 2013-07-08 NOTE — MAU Note (Signed)
Pt reprots having pelvic pain for over a month. Had appointment with [facility] on Monday but was unable to keep it due to child's illness. Pt has marina in place but has been in a little over 5 years. Thinks it may have come dislodged

## 2013-11-22 ENCOUNTER — Encounter (HOSPITAL_COMMUNITY): Payer: Self-pay | Admitting: General Practice

## 2014-02-22 ENCOUNTER — Encounter (HOSPITAL_COMMUNITY): Payer: Self-pay

## 2014-02-22 ENCOUNTER — Emergency Department (HOSPITAL_COMMUNITY)
Admission: EM | Admit: 2014-02-22 | Discharge: 2014-02-22 | Disposition: A | Discharge status: Home / Self Care | Payer: Self-pay | Attending: Emergency Medicine | Admitting: Emergency Medicine

## 2014-02-22 DIAGNOSIS — Z91040 Latex allergy status: Secondary | ICD-10-CM | POA: Insufficient documentation

## 2014-02-22 DIAGNOSIS — B9689 Other specified bacterial agents as the cause of diseases classified elsewhere: Secondary | ICD-10-CM

## 2014-02-22 DIAGNOSIS — J45909 Unspecified asthma, uncomplicated: Secondary | ICD-10-CM | POA: Insufficient documentation

## 2014-02-22 DIAGNOSIS — N898 Other specified noninflammatory disorders of vagina: Secondary | ICD-10-CM

## 2014-02-22 DIAGNOSIS — Z3202 Encounter for pregnancy test, result negative: Secondary | ICD-10-CM | POA: Insufficient documentation

## 2014-02-22 DIAGNOSIS — N760 Acute vaginitis: Secondary | ICD-10-CM | POA: Insufficient documentation

## 2014-02-22 DIAGNOSIS — Z975 Presence of (intrauterine) contraceptive device: Secondary | ICD-10-CM | POA: Insufficient documentation

## 2014-02-22 DIAGNOSIS — Z79899 Other long term (current) drug therapy: Secondary | ICD-10-CM | POA: Insufficient documentation

## 2014-02-22 LAB — URINALYSIS, ROUTINE W REFLEX MICROSCOPIC
Bilirubin Urine: NEGATIVE
Glucose, UA: NEGATIVE mg/dL
KETONES UR: NEGATIVE mg/dL
Leukocytes, UA: NEGATIVE
NITRITE: NEGATIVE
PROTEIN: NEGATIVE mg/dL
Specific Gravity, Urine: 1.023 (ref 1.005–1.030)
Urobilinogen, UA: 1 mg/dL (ref 0.0–1.0)
pH: 6 (ref 5.0–8.0)

## 2014-02-22 LAB — WET PREP, GENITAL
Trich, Wet Prep: NONE SEEN
Yeast Wet Prep HPF POC: NONE SEEN

## 2014-02-22 LAB — URINE MICROSCOPIC-ADD ON

## 2014-02-22 LAB — PREGNANCY, URINE: PREG TEST UR: NEGATIVE

## 2014-02-22 MED ORDER — METRONIDAZOLE 500 MG PO TABS: 500.0000 mg | ORAL_TABLET | Freq: Two times a day (BID) | ORAL | Status: DC

## 2014-02-22 NOTE — Progress Notes (Signed)
Pt confirms she has been seen last by Dr Aleen Campitysinger and will return if needed

## 2014-02-22 NOTE — ED Notes (Signed)
Pt c/o vag bleeding x last 3 days and discharge that "smells like eggs" x past week.  States it's not associated with her menstrual cycle.  Denies N/V but had diarrhea last night.  Denies any other symptoms.

## 2014-02-22 NOTE — Discharge Instructions (Signed)
Bacterial Vaginosis °Bacterial vaginosis is a vaginal infection that occurs when the normal balance of bacteria in the vagina is disrupted. It results from an overgrowth of certain bacteria. This is the most common vaginal infection in women of childbearing age. Treatment is important to prevent complications, especially in pregnant women, as it can cause a premature delivery. °CAUSES  °Bacterial vaginosis is caused by an increase in harmful bacteria that are normally present in smaller amounts in the vagina. Several different kinds of bacteria can cause bacterial vaginosis. However, the reason that the condition develops is not fully understood. °RISK FACTORS °Certain activities or behaviors can put you at an increased risk of developing bacterial vaginosis, including: °· Having a new sex partner or multiple sex partners. °· Douching. °· Using an intrauterine device (IUD) for contraception. °Women do not get bacterial vaginosis from toilet seats, bedding, swimming pools, or contact with objects around them. °SIGNS AND SYMPTOMS  °Some women with bacterial vaginosis have no signs or symptoms. Common symptoms include: °· Grey vaginal discharge. °· A fishlike odor with discharge, especially after sexual intercourse. °· Itching or burning of the vagina and vulva. °· Burning or pain with urination. °DIAGNOSIS  °Your health care provider will take a medical history and examine the vagina for signs of bacterial vaginosis. A sample of vaginal fluid may be taken. Your health care provider will look at this sample under a microscope to check for bacteria and abnormal cells. A vaginal pH test may also be done.  °TREATMENT  °Bacterial vaginosis may be treated with antibiotic medicines. These may be given in the form of a pill or a vaginal cream. A second round of antibiotics may be prescribed if the condition comes back after treatment.  °HOME CARE INSTRUCTIONS  °· Only take over-the-counter or prescription medicines as  directed by your health care provider. °· If antibiotic medicine was prescribed, take it as directed. Make sure you finish it even if you start to feel better. °· Do not have sex until treatment is completed. °· Tell all sexual partners that you have a vaginal infection. They should see their health care provider and be treated if they have problems, such as a mild rash or itching. °· Practice safe sex by using condoms and only having one sex partner. °SEEK MEDICAL CARE IF:  °· Your symptoms are not improving after 3 days of treatment. °· You have increased discharge or pain. °· You have a fever. °MAKE SURE YOU:  °· Understand these instructions. °· Will watch your condition. °· Will get help right away if you are not doing well or get worse. °FOR MORE INFORMATION  °Centers for Disease Control and Prevention, Division of STD Prevention: www.cdc.gov/std °American Sexual Health Association (ASHA): www.ashastd.org  °Document Released: 01/07/2005 Document Revised: 10/28/2012 Document Reviewed: 08/19/2012 °ExitCare® Patient Information ©2015 [employer]. This information is not intended to replace advice given to you by your health care provider. Make sure you discuss any questions you have with your health care provider. ° ° °Emergency Department Resource Guide °1) Find a Doctor and Pay Out of Pocket °Although you won't have to find out who is covered by your insurance plan, it is a good idea to ask around and get recommendations. You will then need to call the office and see if the doctor you have chosen will accept you as a new patient and what types of options they offer for patients who are self-pay. Some doctors offer discounts or will set up payment plans   for their patients who do not have insurance, but you will need to ask so you aren't surprised when you get to your appointment. ° °2) Contact Your Local Health Department °Not all health departments have doctors that can see patients for sick visits, but many  do, so it is worth a call to see if yours does. If you don't know where your local health department is, you can check in your phone book. The CDC also has a tool to help you locate your state's health department, and many state websites also have listings of all of their local health departments. ° °3) Find a Walk-in Clinic °If your illness is not likely to be very severe or complicated, you may want to try a walk in clinic. These are popping up all over the country in pharmacies, drugstores, and shopping centers. They're usually staffed by nurse practitioners or physician assistants that have been trained to treat common illnesses and complaints. They're usually fairly quick and inexpensive. However, if you have serious medical issues or chronic medical problems, these are probably not your best option. ° °No Primary Care Doctor: °- Call Health Connect at  832-8000 - they can help you locate a primary care doctor that  accepts your insurance, provides certain services, etc. °- Physician Referral Service- 1-800-533-3463 ° °Chronic Pain Problems: °Organization         Address  Phone   Notes  °[facility]  ([phone]) [phone] Patients need to be referred by their primary care doctor.  ° °Medication Assistance: °Organization         Address  Phone   Notes  °[place] [facility] [address] °[place], [place] [address] (336) 641-8030 --Must be a resident of [place] °-- Must have NO insurance coverage whatsoever (no Medicaid/ Medicare, etc.) °-- The pt. MUST have a primary care doctor that directs their care regularly and follows them in the community °  °[facility]  (866) 331-1348   °[employer]  (888) 892-1162   ° °Agencies that provide inexpensive medical care: °Organization         Address  Phone   Notes  °[facility]  (336) 832-8035   °[facility]    (336) 832-7272   °[facility] [address] °[place], [place] [address] (336) 832-4777   °[facility] [address], °[place] (336) 271-4999   °[facility]    (336) 373-0678   °[facility]    (336) 272-1050   °[facility] ° [address], [place] Phone:  (336) 832-4444, Fax:  (336) 832-4440 Hours of Operation:  9 am - 6 pm, M-F.  Also accepts Medicaid/Medicare and self-pay.  °[facility] ° [address], [place] Phone: (336) 832-3150, Fax: (336) [phone]. Hours of Operation:  8:30 am - 5:30 pm, M-F.  Also accepts Medicaid and self-pay.  °[facility] [place] [address], [place] Phone: (336) 878-6027   °[facility] [address], [place], [place] (336)723-1848, Ext. 123 Mondays & Thursdays: 7-9 AM.  First 15 patients are seen on a first come, first serve basis. °  ° °Medicaid-accepting [place] Providers: ° °Organization         Address  Phone   Notes  °[facility] [address], [place] (336) 641-2100 Also accepts self-pay patients.  °  [facility] [address], [place] ° (336) 856-9996   °[facility] [address], [place] (336) 288-8857   °[facility] [address] [place] [address], [place] (336) 299-7000   °Veita Bland [address], [place]  ° (336) 373-1557 Only accepts [place] Access Medicaid patients after they have their name applied to their card.  ° °Self-Pay (no insurance) in [place]: ° °Organization         Address  Phone   Notes  °Sickle Cell Patients, [facility] [address], [place] (336) 832-1970   °[facility] [address], [place] (336) 832-4400   °[facility] ° [address], [place] (336) 992-4800   °[facility]/Dr. Osei-Bonsu ° [address] [place] [address], [place] or  [address], [place] (336) [phone] Phone number for both [place] and [place] locations is the same.  °[facility] [address], [place] (336) 299-0000   °[facility] [address] [place] [address], [place] or [address] (336) 852-7530 °(336) 878-2260   °[facility] [address], [place] (336) 350-1642, phone; (336) 294-5005, fax Sees patients 1st and 3rd Saturday of every month.  Must not qualify for public or private insurance (i.e. Medicaid, Medicare, [place] Health Choice, Veterans' Benefits) • Household income should be no more than 200% of the poverty level •The clinic cannot treat you if you are pregnant or think you are pregnant • Sexually transmitted diseases are not treated at the clinic.  ° ° °Dental Care: °Organization         Address  Phone  Notes  °[place] [facility] [address], [place] (336) 641-6152 Accepts children up to age 21 who are enrolled in Medicaid or [place] Health Choice; pregnant women with a Medicaid card; and children who have applied for Medicaid or [place] Health Choice, but were declined, whose parents can pay a reduced fee at time of service.  °[place] [place]  [address], [place] (336) 641-7733 Accepts children up to age 21 who are enrolled in Medicaid or [place] Health Choice; pregnant women with a Medicaid card; and children who have applied for Medicaid or [place] Health Choice, but were declined, whose parents can pay a reduced fee at time of service.  °[facility] ° [address], [place] (336) 641-4533 Patients are seen by appointment only. Walk-ins are not accepted. [facility] will see patients 18 years of age and older. °Monday - Tuesday (8am-5pm) °Most Wednesdays (8:30-5pm) °$30 per visit, cash only  °[facility] ° [address], [place] (336) 641-4533 Patients are seen by appointment only. Walk-ins are not accepted. [facility] will see patients 18 years of age and older. °One Wednesday Evening (Monthly: Volunteer Based).  $30 per visit, cash only  °[facility]  (919) [phone] for adults; Children under age 4, call Graduate Pediatric Dentistry at ([phone]) [phone]. Children aged 4-14, please call ([phone]) [phone] to request a pediatric application. ° Dental services are provided in all areas of dental care including fillings, crowns and bridges, complete and partial dentures, implants, gum treatment, root canals, and extractions. Preventive care is also provided. Treatment is provided to both adults   and children. °Patients are selected via a lottery and there is often a waiting list. °  °[facility] [address], °[place] ° (336) 763-8833 www.drcivils.com °  °[facility] [address], [place], [place] (336)723-1848, Ext. 123 Second and Fourth Thursday of each month, opens at 6:30 AM; Clinic ends at 9 AM.  Patients are seen on a first-come first-served basis, and a limited number are seen during each clinic.  ° °[facility] ° [address], [place], [place] (336) 723-7904   Eligibility Requirements °You must have lived in [place], [place], or [place] counties for at least the last three months. °  You cannot be eligible for state or federal sponsored healthcare insurance, including Veterans Administration, Medicaid, or Medicare. °  You generally cannot be eligible for healthcare insurance through your employer.  °  How to apply: °Eligibility screenings are held every Tuesday and Wednesday afternoon from 1:00 pm until 4:00 pm. You do not need an appointment for the interview!  °[facility] [address], [place], [place] 336-631-2330   °[facility]  336-342-8273   °[facility]  336-703-3100   °[place]  336-570-6415   ° °Behavioral Health Resources in the Community: °Intensive Outpatient Programs °Organization         Address  Phone  Notes  °[facility] [address], [place], [place] 336-878-6098   °[facility] [address], [place], [place] 336-832-9800   °[facility] [address], [place], [place] ° 336-882-2125   °[facility] [address],  °[place], [place] 1-800-853-5163 or 336-641-4981   °Substance Abuse Resources °Organization         Address  Phone  Notes  °[facility]  336-882-2125   °[facility]  336-784-9470   °[facility]  336-285-9073   °[facility]  336-845-3988   °Residential & Outpatient Substance Abuse Program  1-800-659-3381   °Psychological Services °Organization         Address  Phone  Notes  °[facility]  336- 832-9600   °[facility]  336- 378-7881   °[facility] [address], [place] 1-800-853-5163 or 336-641-4981   ° °Mobile Crisis Teams °Organization         Address  Phone  Notes  °[facility], Mobile Crisis Care Unit  1-877-626-1772   °Assertive °[facility] ° [address] [place], [place] 336-834-9664   °Sharon DeEsch [address] °[place] 336-554-5454   ° °Self-Help/Support Groups °Organization         Address  Phone             Notes  °[facility] [place] - variety of support groups  336- 373-1402 Call for more information  °Narcotics Anonymous (NA), [facility] [address], °[place]  2 meetings at this location  ° °Residential Treatment Programs °Organization         Address  Phone  Notes  °[facility] [address],    °[place]  1-866-801-8205   °[facility] ° [address], [place], [place] 704-293-8524   °[facility] [address], [place] 336-845-3988  Admissions: 8am-3pm M-F  °[facility] [address]    °[place], [place] 336-841-1104   °[facility]   [facility] [address], [place], [place] [phone]   [facility] [address]  [place], [place] [phone]   [facility] Intensive Outpatient [address], [place], [place] [phone]   [facility] ([facility] [address]  [place], [place] [phone] or [phone]   [facility] ([facility] [address] [place], [place] [phone] Accepts Medicaid  [facility] [address]  [place] [phone] Substance Abuse/Addiction Treatment   [facility]         Address  Phone  Notes  [facility]  [phone](888) [phone]   Angie FavaJulie Brannon, PhD [address] [place], [place]   [phone](336) [phone] or [phone](336) [phone]   [facility]   [address] [place], [place] [phone](336) [phone]   [facility] [address], [place], [place] [phone](336) ([phone] Insurance/Medicaid/sponsorship through [facility] [address]                                    [place], [place] [phone](336) [phone] Therapy/tele-psych/case  [facility] [address]   [place], [place] [phone](336) [phone]    Dr. Lolly MustacheArfeen  [phone](336) [phone]   [facility]  [employer] [facility] 1) [address], [place] 2) [address] [place] [address], [place] 3)  [address], [place] [phone](336) [phone](336) [phone]  [phone](336) [phone]   [facility] Child [facility] Hot[facility] (709)051-1661(336) 416-537-7611 or [phone](336) [phone] (After Hours)      You were evaluated in the ED today for your vaginal discharge. There does not appear to be an emergent cause for your symptoms at this time. You were found to have bacterial vaginosis. Please take your metronidazole as directed to help with this. Please follow-up with your primary care for further  evaluation and management of your symptoms. Return to ED for new or worsening symptoms.

## 2014-02-22 NOTE — ED Provider Notes (Signed)
CSN: [id number]     Arrival date & time 02/22/14  1421 History   First MD Initiated Contact with Patient 02/22/14 1459     No chief complaint on file.    (Consider location/radiation/quality/duration/timing/severity/associated sxs/prior Treatment) HPI Katie Riggs is a 29 y.o. female who comes in for evaluation of vaginal discharge. Patient states for the past 3 days she has had a "milky blood-tinged discharge". She reports the blood part has resolved and now it just a milky discharge that "smells like eggs". She has tried tea tree oil at home with no relief. Her last menstrual period was January 23 and was normal for her. She reports not being sexually active since her last menstrual cycle so she doubts being pregnant. At this time she denies any fevers, abdominal pain, nausea or vomiting, dysuria, hematuria, constipation or diarrhea. No headaches, changes in vision, chest pain, short of breath, cough, numbness or weakness, syncope.  Past Medical History  Diagnosis Date  . Asthma   . IUD (intrauterine device) in place     Mirena.  States it's been out since June 2015   Past Surgical History  Procedure Laterality Date  . Wisdom tooth extraction     Family History  Problem Relation Age of Onset  . Stroke Mother   . Hypertension Father   . Diabetes Father    History  Substance Use Topics  . Smoking status: Never Smoker   . Smokeless tobacco: Never Used  . Alcohol Use: Yes     Comment: ocassionally beer or wine   OB History    Gravida Para Term Preterm AB TAB SAB Ectopic Multiple Living   1 1 1  0 0 0 0 0 0 1     Review of Systems  All other systems reviewed and are negative.  A 10 point review of systems was completed and was negative except for pertinent positives and negatives as mentioned in the history of present illness     Allergies  Bee venom and Latex  Home Medications   Prior to Admission medications   Medication Sig Start Date End Date Taking?  Authorizing Provider  acetaminophen (TYLENOL) 500 MG tablet Take 500 mg by mouth every 6 (six) hours as needed for headache.    Yes Historical Provider, MD  albuterol (PROVENTIL HFA;VENTOLIN HFA) 108 (90 BASE) MCG/ACT inhaler Inhale 2 puffs into the lungs every 6 (six) hours as needed for wheezing or shortness of breath.    Yes Historical Provider, MD  diphenhydrAMINE (BENADRYL) 25 MG tablet Take 25-50 mg by mouth every 6 (six) hours as needed for allergies.   Yes Historical Provider, MD  EPINEPHrine (EPIPEN IJ) Inject as directed once.   Yes Historical Provider, MD  metroNIDAZOLE (FLAGYL) 500 MG tablet Take 1 tablet (500 mg total) by mouth 2 (two) times daily. 02/22/14   Earle GellBenjamin W Nkosi Cortright, PA-C   BP 134/83 mmHg  Pulse 95  Temp(Src) 98.6 F (37 C) (Oral)  Resp 16  SpO2 99%  LMP 02/09/2014 Physical Exam  Constitutional: She is oriented to person, place, and time. She appears well-developed and well-nourished.  HENT:  Head: Normocephalic and atraumatic.  Mouth/Throat: Oropharynx is clear and moist.  Eyes: Conjunctivae are normal. Pupils are equal, round, and reactive to light. Right eye exhibits no discharge. Left eye exhibits no discharge. No scleral icterus.  Neck: Neck supple.  Cardiovascular: Normal rate, regular rhythm and normal heart sounds.   Pulmonary/Chest: Effort normal and breath sounds normal. No respiratory distress. She  has no wheezes. She has no rales.  Abdominal: Soft. There is no tenderness.  Genitourinary:  Chaperone was present for the entire genital exam. No lesions or rashes appreciated on vulva. Cervix visualized on speculum exam and appropriate cultures sampled. Cervix appears irritated and mildly friable, but without tenderness. Scant blood in vaginal vault. Discharge is thick with whitish to yellow coloration. Upon bi manual exam- No TTP of the adnexa, no cervical motion tenderness. No fullness or masses appreciated. No abnormalities appreciated in structural  anatomy.   Musculoskeletal: She exhibits no tenderness.  Neurological: She is alert and oriented to person, place, and time.  Cranial Nerves II-XII grossly intact  Skin: Skin is warm and dry. No rash noted.  Psychiatric: She has a normal mood and affect.  Nursing note and vitals reviewed.   ED Course  Procedures (including critical care time) Labs Review Labs Reviewed  WET PREP, GENITAL - Abnormal; Notable for the following:    Clue Cells Wet Prep HPF POC MODERATE (*)    WBC, Wet Prep HPF POC FEW (*)    All other components within normal limits  URINALYSIS, ROUTINE W REFLEX MICROSCOPIC - Abnormal; Notable for the following:    Hgb urine dipstick SMALL (*)    All other components within normal limits  PREGNANCY, URINE  URINE MICROSCOPIC-ADD ON  GC/CHLAMYDIA PROBE AMP ([place])    Imaging Review No results found.   EKG Interpretation None     Meds given in ED:  Medications - No data to display  Discharge Medication List as of 02/22/2014  4:52 PM    START taking these medications   Details  metroNIDAZOLE (FLAGYL) 500 MG tablet Take 1 tablet (500 mg total) by mouth 2 (two) times daily., Starting 02/22/2014, Until Discontinued, Print       Filed Vitals:   02/22/14 1434  BP: 134/83  Pulse: 95  Temp: 98.6 F (37 C)  TempSrc: Oral  Resp: 16  SpO2: 99%    MDM  Vitals stable - WNL -afebrile Pt resting comfortably in ED. PE--normal abd exam. Pelvic not concerning. Labwork--Clue Cells on Wet prep, neg preg.  DDX--Will trxt for BV. No evidence of other acute or emergent pathology. Low concern for Ovarian torsion, TOA, ectopic Will DC with Metronidazole. I discussed all relevant lab findings and imaging results with pt and they verbalized understanding. Discussed f/u with PCP within 48 hrs and return precautions, pt very amenable to plan.  Final diagnoses:  Bacterial vaginosis  Vaginal discharge       Sharlene Motts, PA-C 02/23/14 1159  Gilda Crease, MD 02/26/14 [phone]

## 2014-02-23 LAB — GC/CHLAMYDIA PROBE AMP (~~LOC~~) NOT AT ARMC
CHLAMYDIA, DNA PROBE: NEGATIVE
Neisseria Gonorrhea: NEGATIVE

## 2014-04-27 IMAGING — US US ABDOMEN COMPLETE
1 series · 14 of 25 positions shown · non-contrast
Comparison: None

CLINICAL DATA: Abdominal pain

COMPLETE ABDOMINAL ULTRASOUND

[Series 1: us abdomen complete · 0.25mm/px · 14 of 81 slices shown]
[im 1/81]
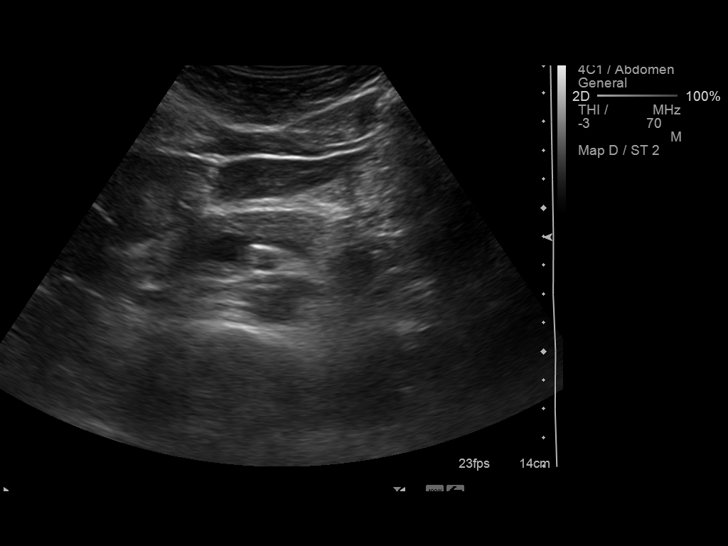
[im 7/81]
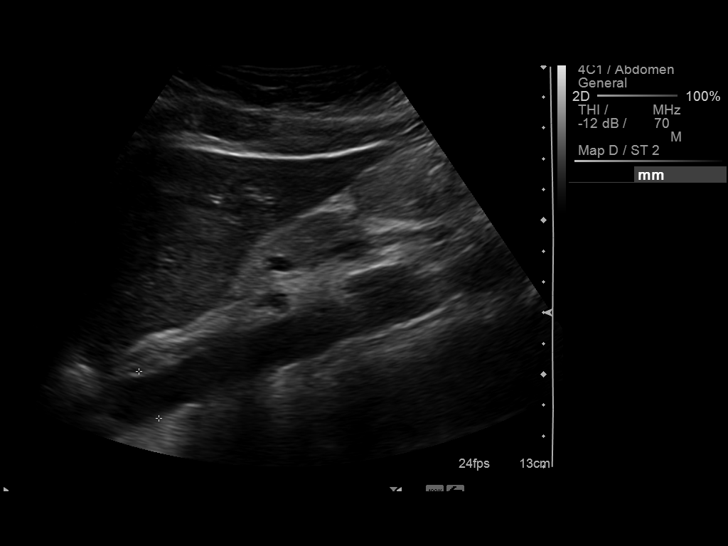
[im 14/81]
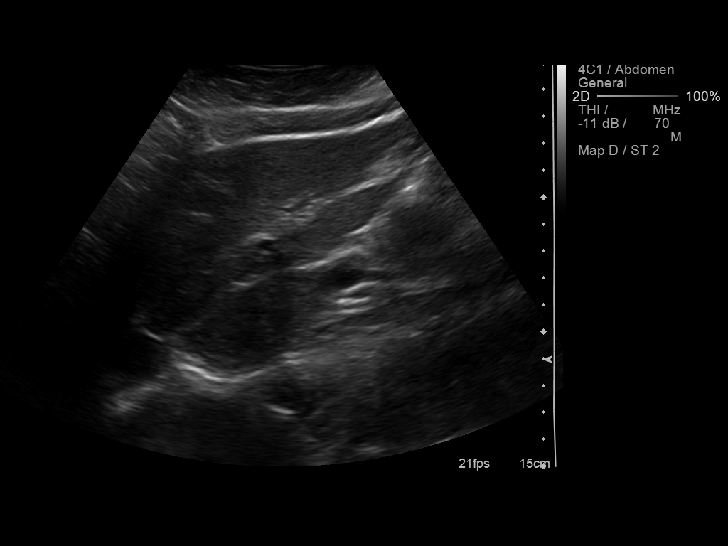
[im 21/81]
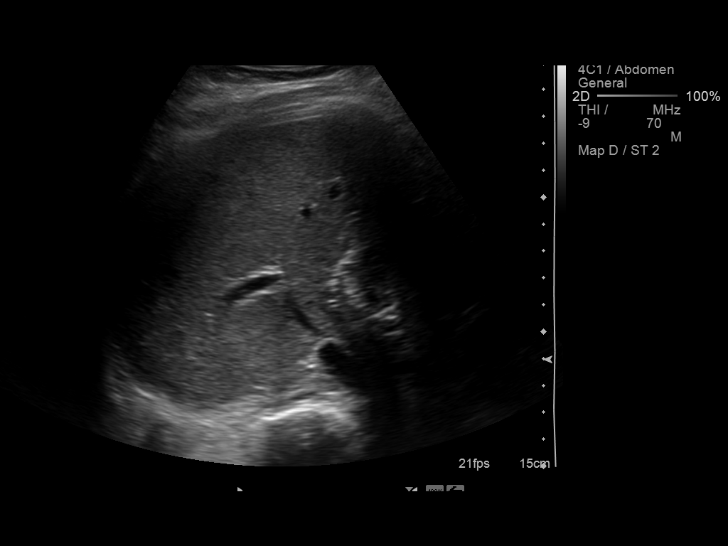
[im 27/81]
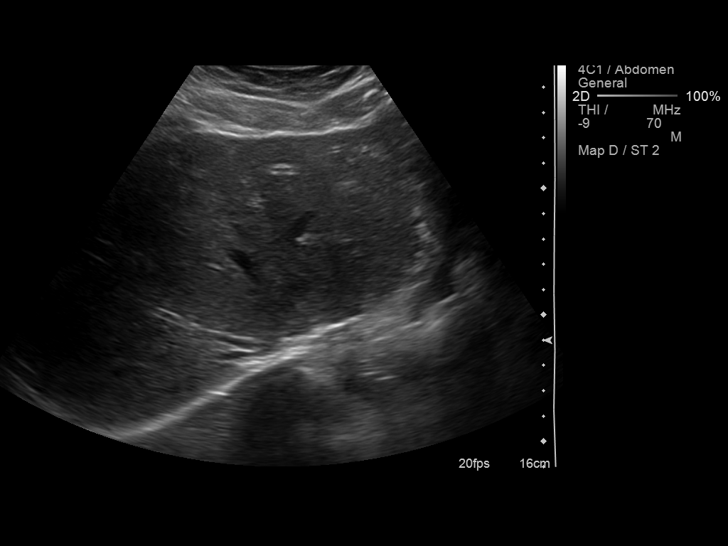
[im 31/81]
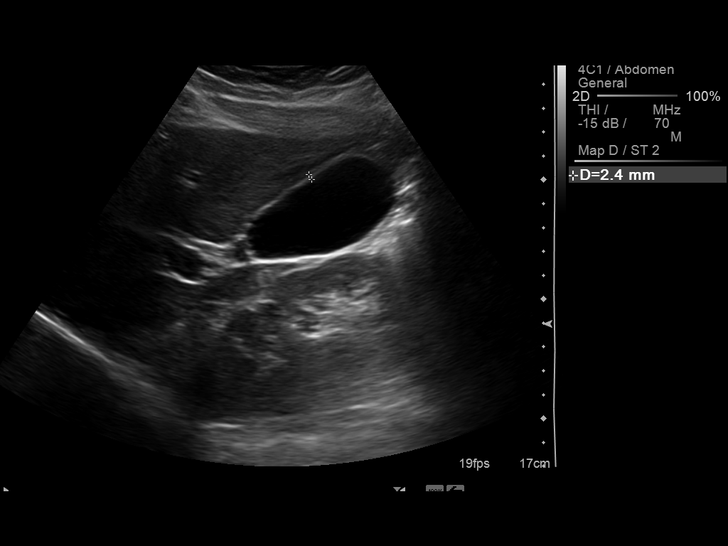
[im 37/81]
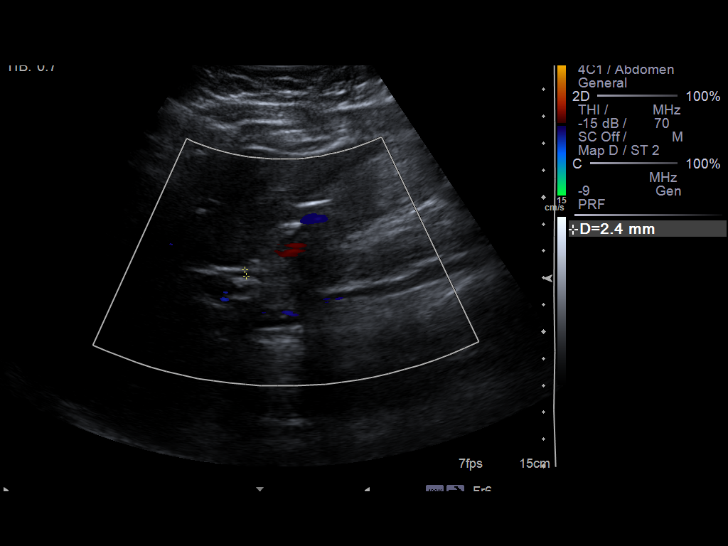
[im 44/81]
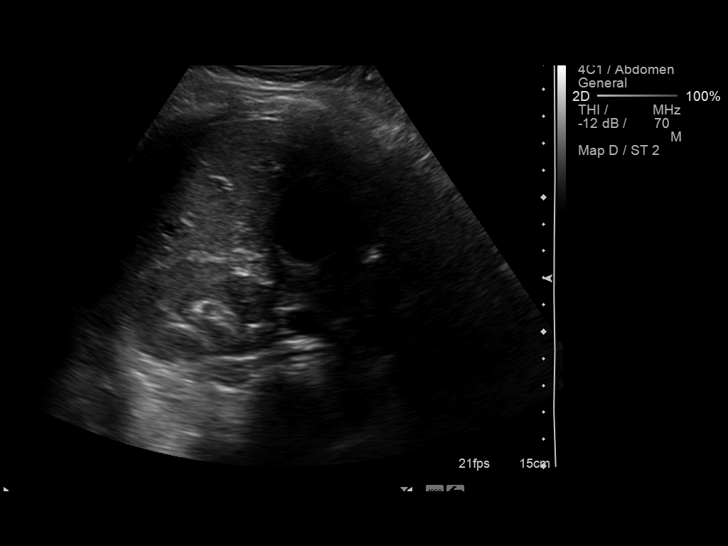
[im 51/81]
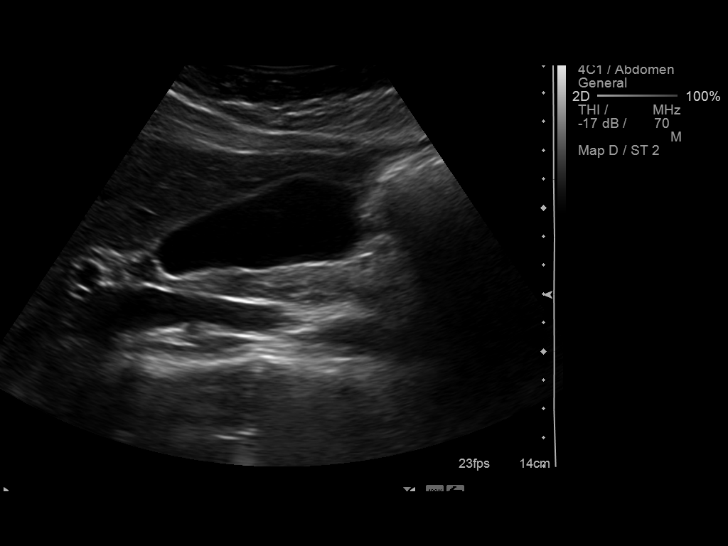
[im 54/81]
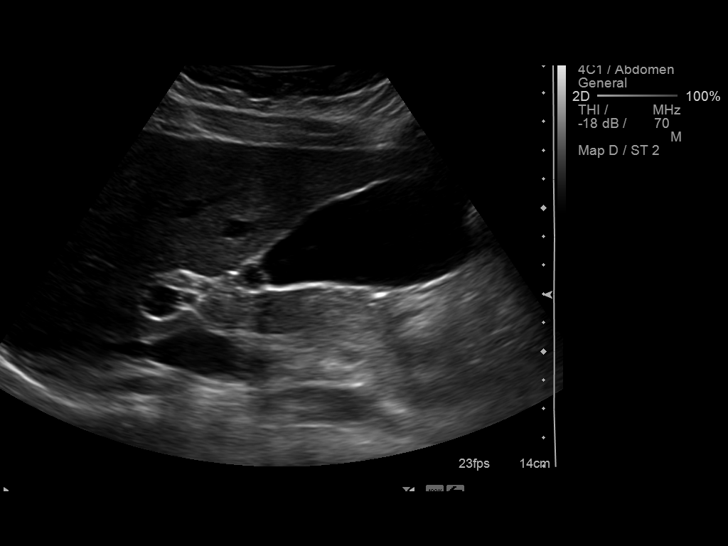
[im 61/81]
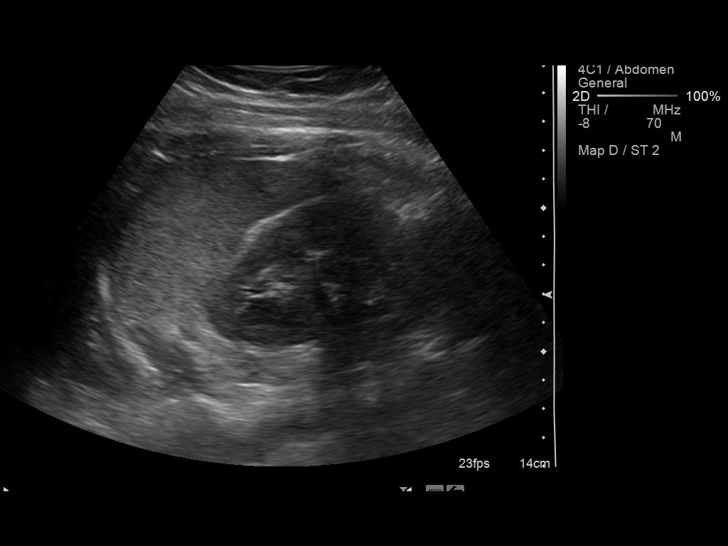
[im 67/81]
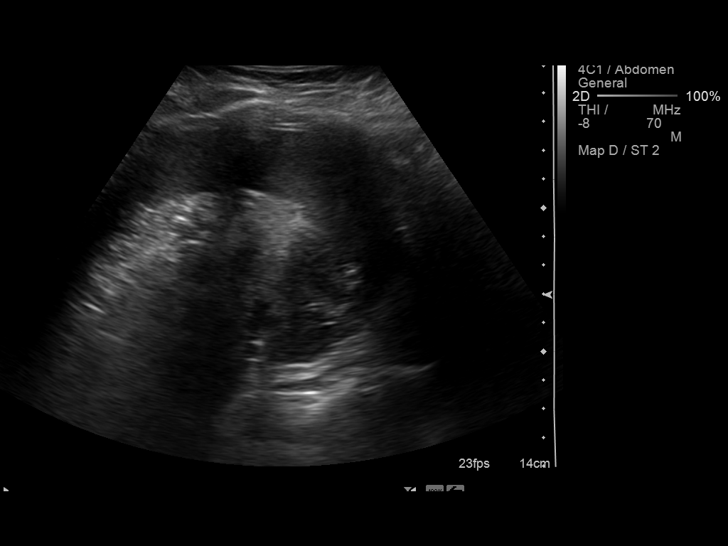
[im 74/81]
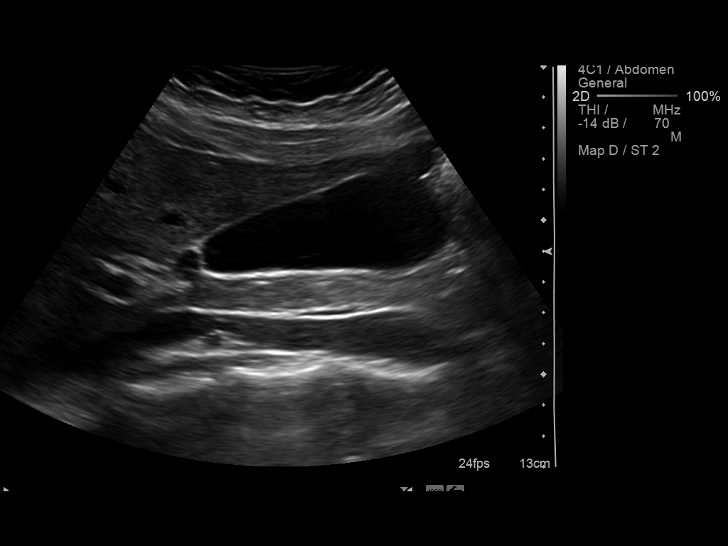
[im 81/81]
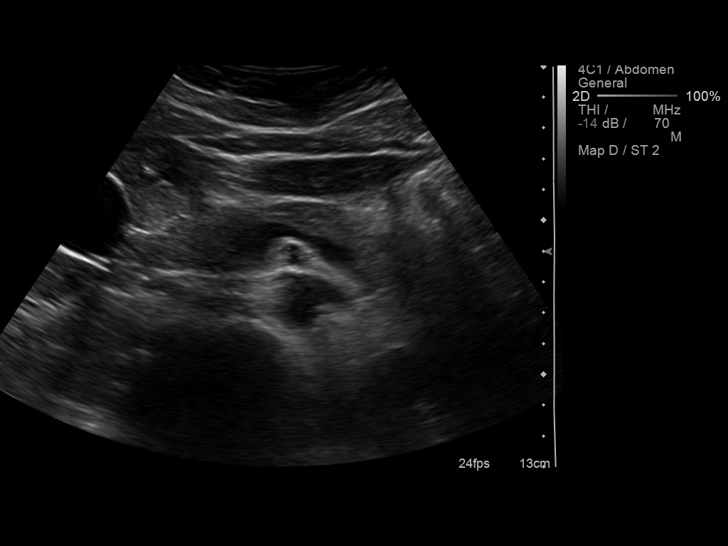

[14 of 25 positions shown; findings below may reference images not displayed]

FINDINGS: Gallbladder: Well distended without wall thickening, stones or
pericholecystic fluid. Negative sonographic Murphy's sign.

Common bile duct:   Normal in caliber without filling defects.

Liver:  Echogenicity is within normal limits.  No focal hepatic
abnormalities are identified.

IVC:  Visualized portions appear unremarkable.

Pancreas:  Visualized portions appear unremarkable.

Spleen:  Visualized portions appear unremarkable.

Right Kidney:   The renal cortical thickness and echogenicity are
preserved.  There is no hydronephrosis or focal abnormality. Renal
length is 10.3 cm.

Left Kidney:   The renal cortical thickness and echogenicity are
preserved.  There is no hydronephrosis or focal abnormality. Renal
length is 10.9 cm.

Abdominal aorta:  Visualized portions appear unremarkable.
IMPRESSION: Unremarkable abdominal ultrasound.

## 2014-04-27 IMAGING — CT CT ABD-PELV W/ CM
2 of 4 series · 14 of 32 positions shown, 19 images · IV contrast (omnipaque)
Comparison: None.

CLINICAL DATA: Diffuse abdominal pain radiating to the back.  Pain
is worse on the right.  Nausea.  Symptoms for 2 days.

CT ABDOMEN AND PELVIS WITH CONTRAST
TECHNIQUE: Multidetector CT imaging of the abdomen and pelvis was
performed following the standard protocol during bolus
administration of intravenous contrast.
Contrast: 100mL OMNIPAQUE IOHEXOL 300 MG/ML  SOLN

[Series 2: routine abdomen · axial · 0.90mm/px · z∈[-452,-122]mm · 7 of 88 slices shown, 12 images]
[im 11/88  soft-tissue]
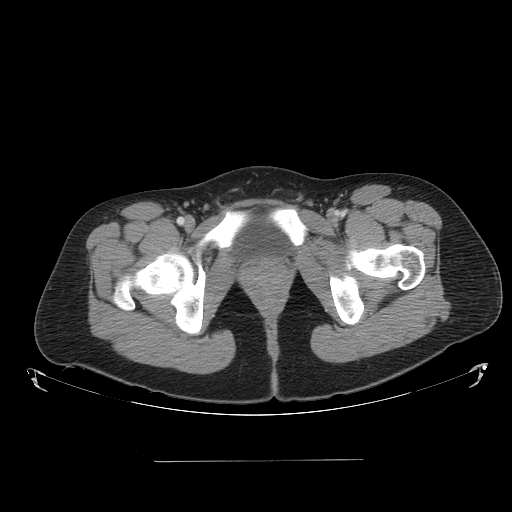
[im 11/88  bone]
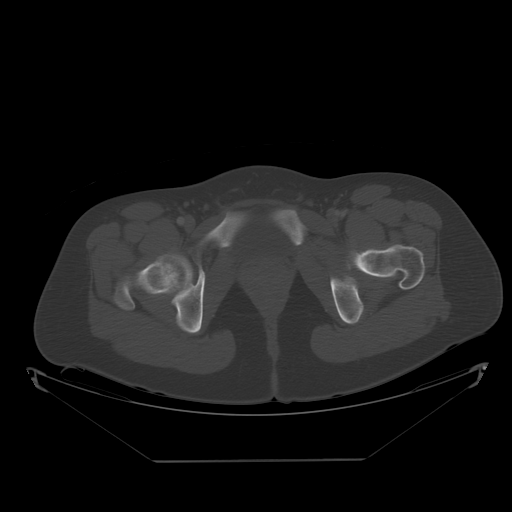
[im 22/88  soft-tissue]
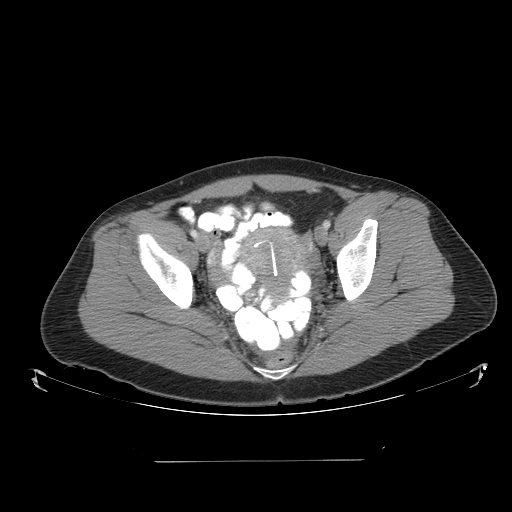
[im 33/88  soft-tissue]
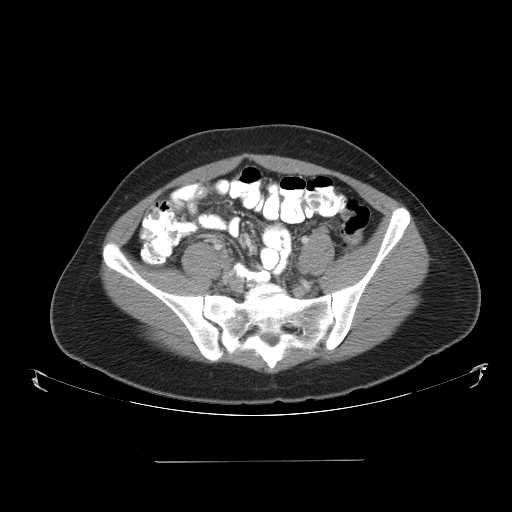
[im 44/88  soft-tissue]
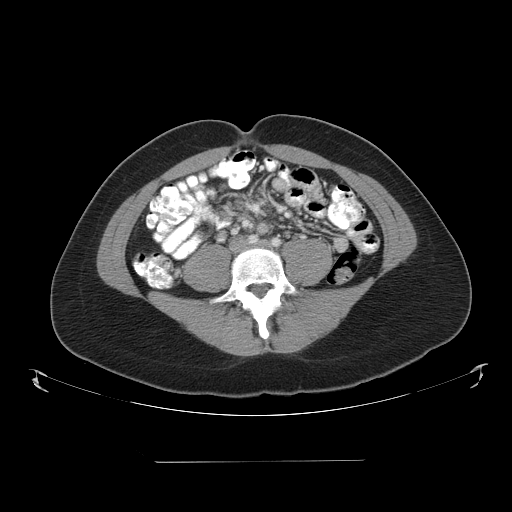
[im 44/88  lung]
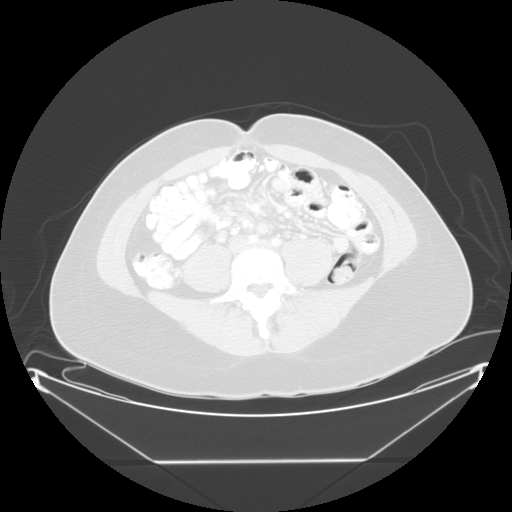
[im 55/88  soft-tissue]
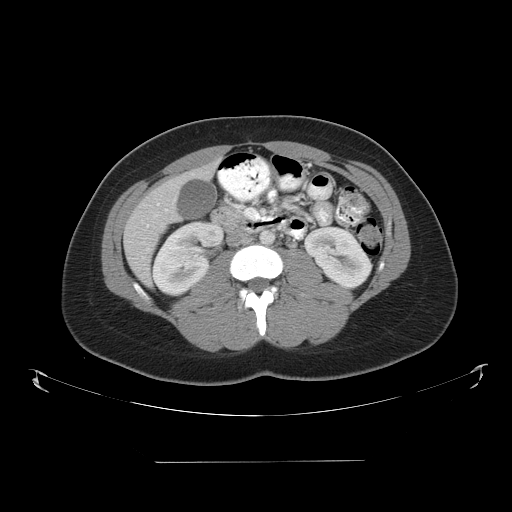
[im 55/88  lung]
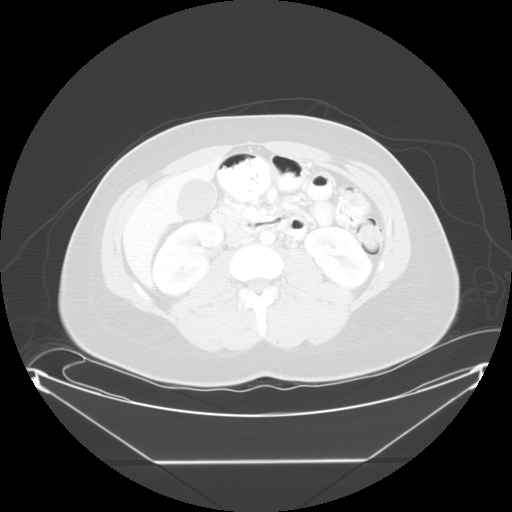
[im 66/88  soft-tissue]
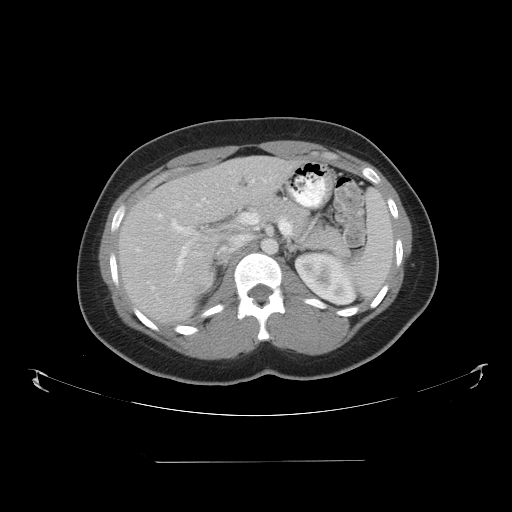
[im 66/88  lung]
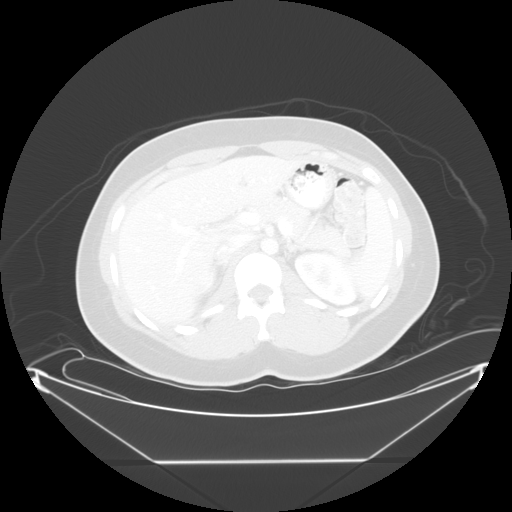
[im 77/88  soft-tissue]
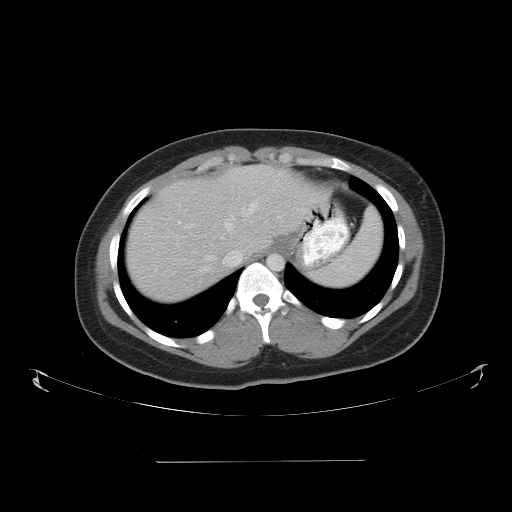
[im 77/88  lung]
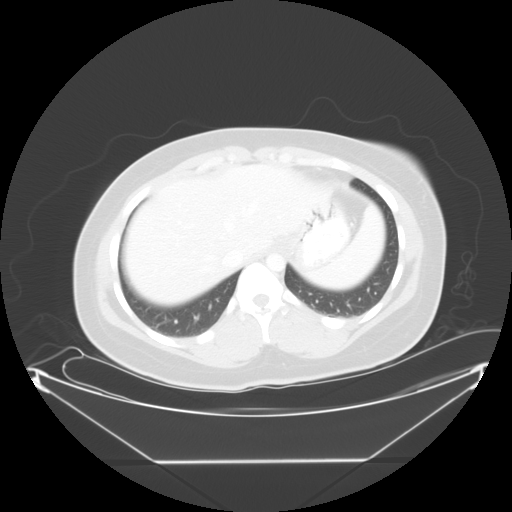

[Series 401: sag · sagittal · 0.90mm/px · 7 of 117 slices shown]
[im 11/117  soft-tissue]
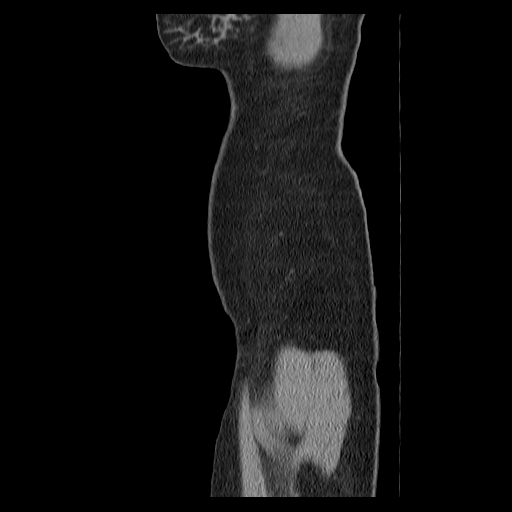
[im 22/117  soft-tissue]
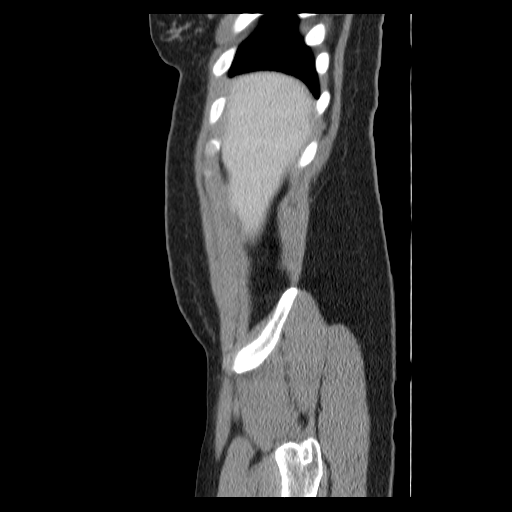
[im 43/117  soft-tissue]
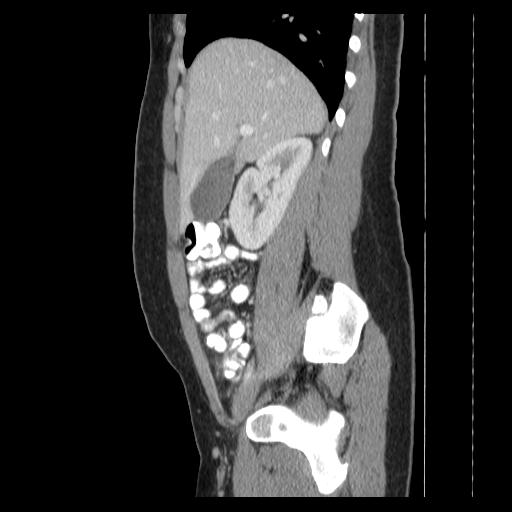
[im 53/117  soft-tissue]
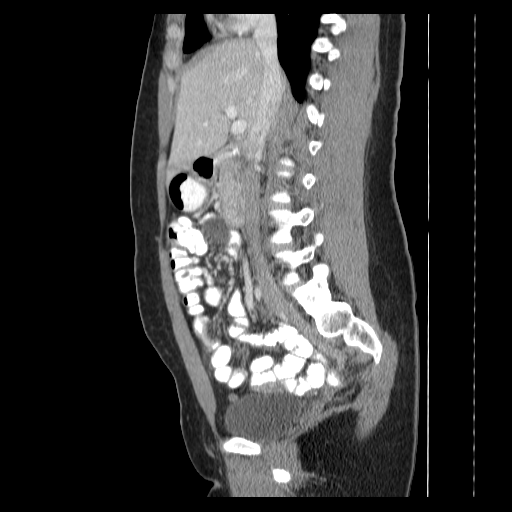
[im 64/117  soft-tissue]
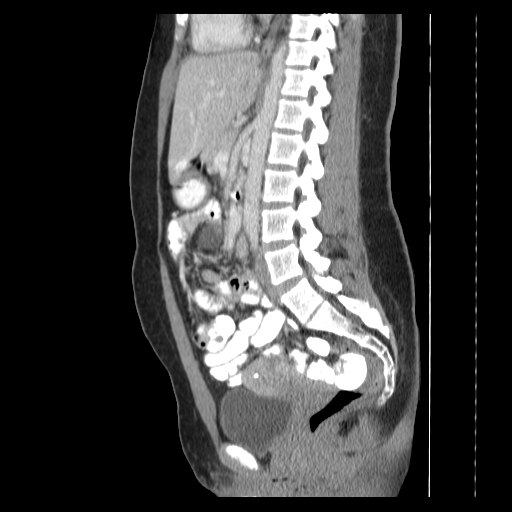
[im 74/117  soft-tissue]
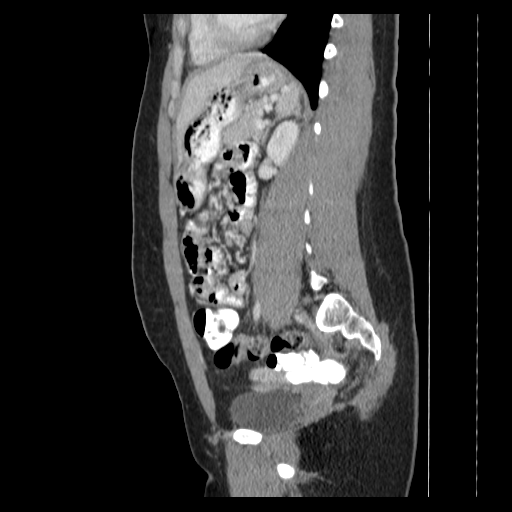
[im 95/117  soft-tissue]
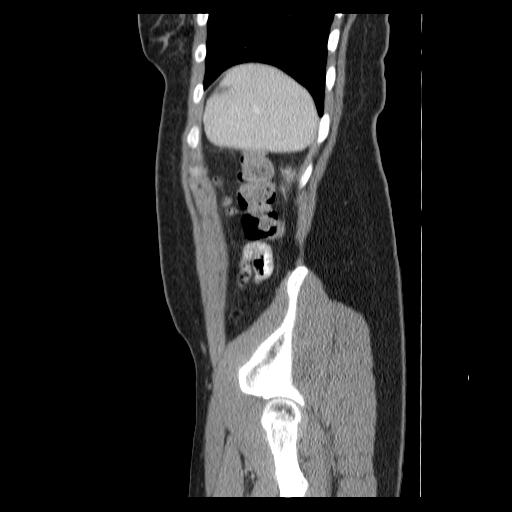

[14 of 32 positions shown; findings below may reference images not displayed]

FINDINGS: The lung bases are clear.  No pleural or pericardial
effusion.

The liver, gallbladder, spleen, biliary tree, adrenal glands,
kidneys and pancreas all appear normal.  There is a cystic lesion
in the root of the mesentery measuring 3.0 x 4.1 cm in the axial
plane on image 40 by 2.7 cm cranial-caudal on the coronal images on
image 32.  The lesion is well-circumscribed there is no soft tissue
component within it.  IUD is in place within the uterus.  Adnexa
are unremarkable.  The stomach, small and large bowel and appendix
all appear normal.  No bony abnormalities identified.
IMPRESSION: 1.  Negative for appendicitis.  No acute finding.
2.  Small cystic lesion in the root the mesentery has benign
features and may represents a peritoneal or mesenteric inclusion
cyst.

## 2014-07-19 ENCOUNTER — Ambulatory Visit (INDEPENDENT_AMBULATORY_CARE_PROVIDER_SITE_OTHER): Payer: BLUE CROSS/BLUE SHIELD | Admitting: Certified Nurse Midwife

## 2014-07-19 ENCOUNTER — Encounter: Payer: Self-pay | Admitting: Certified Nurse Midwife

## 2014-07-19 VITALS — BP 126/89 | HR 83 | Temp 98.6°F | Ht 67.0 in | Wt 183.0 lb

## 2014-07-19 DIAGNOSIS — Z01419 Encounter for gynecological examination (general) (routine) without abnormal findings: Secondary | ICD-10-CM

## 2014-07-19 DIAGNOSIS — N760 Acute vaginitis: Secondary | ICD-10-CM

## 2014-07-19 DIAGNOSIS — B3731 Acute candidiasis of vulva and vagina: Secondary | ICD-10-CM

## 2014-07-19 DIAGNOSIS — Z113 Encounter for screening for infections with a predominantly sexual mode of transmission: Secondary | ICD-10-CM

## 2014-07-19 DIAGNOSIS — B9689 Other specified bacterial agents as the cause of diseases classified elsewhere: Secondary | ICD-10-CM

## 2014-07-19 DIAGNOSIS — B373 Candidiasis of vulva and vagina: Secondary | ICD-10-CM

## 2014-07-19 LAB — CBC WITH DIFFERENTIAL/PLATELET
BASOS ABS: 0 10*3/uL (ref 0.0–0.1)
BASOS PCT: 0 % (ref 0–1)
Eosinophils Absolute: 0 10*3/uL (ref 0.0–0.7)
Eosinophils Relative: 1 % (ref 0–5)
HCT: 39.3 % (ref 36.0–46.0)
Hemoglobin: 13.3 g/dL (ref 12.0–15.0)
Lymphocytes Relative: 37 % (ref 12–46)
Lymphs Abs: 1.7 10*3/uL (ref 0.7–4.0)
MCH: 29 pg (ref 26.0–34.0)
MCHC: 33.8 g/dL (ref 30.0–36.0)
MCV: 85.8 fL (ref 78.0–100.0)
MONO ABS: 0.4 10*3/uL (ref 0.1–1.0)
MPV: 9.9 fL (ref 8.6–12.4)
Monocytes Relative: 9 % (ref 3–12)
NEUTROS ABS: 2.4 10*3/uL (ref 1.7–7.7)
Neutrophils Relative %: 53 % (ref 43–77)
PLATELETS: 353 10*3/uL (ref 150–400)
RBC: 4.58 MIL/uL (ref 3.87–5.11)
RDW: 13.5 % (ref 11.5–15.5)
WBC: 4.5 10*3/uL (ref 4.0–10.5)

## 2014-07-19 LAB — COMPREHENSIVE METABOLIC PANEL
ALBUMIN: 4.4 g/dL (ref 3.5–5.2)
ALT: 12 U/L (ref 0–35)
AST: 17 U/L (ref 0–37)
Alkaline Phosphatase: 56 U/L (ref 39–117)
BILIRUBIN TOTAL: 0.3 mg/dL (ref 0.2–1.2)
BUN: 7 mg/dL (ref 6–23)
CALCIUM: 9.3 mg/dL (ref 8.4–10.5)
CO2: 26 mEq/L (ref 19–32)
Chloride: 102 mEq/L (ref 96–112)
Creat: 0.68 mg/dL (ref 0.50–1.10)
Glucose, Bld: 83 mg/dL (ref 70–99)
Potassium: 4.3 mEq/L (ref 3.5–5.3)
Sodium: 138 mEq/L (ref 135–145)
Total Protein: 7.8 g/dL (ref 6.0–8.3)

## 2014-07-19 LAB — TRIGLYCERIDES: TRIGLYCERIDES: 96 mg/dL (ref <150)

## 2014-07-19 LAB — HDL CHOLESTEROL: HDL: 52 mg/dL (ref >=46)

## 2014-07-19 LAB — CHOLESTEROL, TOTAL: CHOLESTEROL: 156 mg/dL (ref 0–200)

## 2014-07-19 LAB — TSH: TSH: 0.534 u[IU]/mL (ref 0.350–4.500)

## 2014-07-19 MED ORDER — FLUCONAZOLE 100 MG PO TABS: 100.0000 mg | ORAL_TABLET | Freq: Once | ORAL | Status: DC

## 2014-07-19 MED ORDER — TINIDAZOLE 500 MG PO TABS: 2.0000 g | ORAL_TABLET | Freq: Every day | ORAL | Status: AC

## 2014-07-19 NOTE — Addendum Note (Signed)
Addended by: Marya LandryFOSTER, Galina Haddox D on: 07/19/2014 11:53 AM   Modules accepted: Orders

## 2014-07-19 NOTE — Progress Notes (Signed)
Patient ID: Katie Riggs, female   DOB: 11/26/1985, 29 y.o.   MRN: [id number]    Subjective:     Katie Riggs is a 29 y.o. female here for a routine exam.  Current complaints: stomach cramping since sunday and spotting with cycles, desires birth control.  Desires full STD screening.  Menses last about 3 days, occasional clots about dime size denies heavy bleeding.   Not currently using birth control, and sexually active.  Had did Mirena in the past, was removed last June, tried ovra for one month, has also tried, the patch, nuva ring and depo injections.  Desires to try the [facility] IUD, was time for Mirena to be removed and liked the Mirena in the past.         Personal health questionnaire:  Is patient Ashkenazi Jewish, have a family history of breast and/or ovarian cancer: no Is there a family history of uterine cancer diagnosed at age < 40, gastrointestinal cancer, urinary tract cancer, family member who is a Personnel officer syndrome-associated carrier: no Is the patient overweight and hypertensive, family history of diabetes, personal history of gestational diabetes, preeclampsia or PCOS: no Is patient over 50, have PCOS,  family history of premature CHD under age 52, diabetes, smoke, have hypertension or peripheral artery disease:  Yes, Dad: DM & HTN, MOM: CVA, deceased, MGM: MI.   At any time, has a partner hit, kicked or otherwise hurt or frightened you?: no Over the past 2 weeks, have you felt down, depressed or hopeless?: no Over the past 2 weeks, have you felt little interest or pleasure in doing things?:no   Gynecologic History Patient's last menstrual period was 07/09/2014 (exact date). Contraception: none Last Pap: unknown. Results were: normal according to the patient Last mammogram: N/A.   Obstetric History OB History  Gravida Para Term Preterm AB SAB TAB Ectopic Multiple Living  0 0 0 0 0 0 1    # Outcome Date GA Lbr Len/2nd Weight Sex Delivery Anes PTL Lv  1 Term  11/25/06     Vag-Spont         Past Medical History  Diagnosis Date  . Asthma   . IUD (intrauterine device) in place     Mirena.  States it's been out since June 2015    Past Surgical History  Procedure Laterality Date  . Wisdom tooth extraction       Current outpatient prescriptions:  .  acetaminophen (TYLENOL) 500 MG tablet, Take 500 mg by mouth every 6 (six) hours as needed for headache. , Disp: , Rfl:  .  albuterol (PROVENTIL HFA;VENTOLIN HFA) 108 (90 BASE) MCG/ACT inhaler, Inhale 2 puffs into the lungs every 6 (six) hours as needed for wheezing or shortness of breath. , Disp: , Rfl:  .  diphenhydrAMINE (BENADRYL) 25 MG tablet, Take 25-50 mg by mouth every 6 (six) hours as needed for allergies., Disp: , Rfl:  .  EPINEPHrine (EPIPEN IJ), Inject as directed once., Disp: , Rfl:  .  fluconazole (DIFLUCAN) 100 MG tablet, Take 1 tablet (100 mg total) by mouth once. Repeat dose in 48-72 hour., Disp: 3 tablet, Rfl: 0 .  metroNIDAZOLE (FLAGYL) 500 MG tablet, Take 1 tablet (500 mg total) by mouth 2 (two) times daily. (Patient not taking: Reported on 07/19/2014), Disp: 14 tablet, Rfl: 0 .  tinidazole (TINDAMAX) 500 MG tablet, Take 4 tablets (2,000 mg total) by mouth daily with breakfast., Disp: 12 tablet, Rfl: 0 Allergies  Allergen Reactions  .  Bee Venom Anaphylaxis  . Latex Anaphylaxis and Rash    History  Substance Use Topics  . Smoking status: Never Smoker   . Smokeless tobacco: Never Used  . Alcohol Use: Yes     Comment: ocassionally beer or wine    Family History  Problem Relation Age of Onset  . Stroke Mother   . Hypertension Father   . Diabetes Father       Review of Systems  Constitutional: negative for fatigue and weight loss Respiratory: negative for cough and wheezing Cardiovascular: negative for chest pain, fatigue and palpitations Gastrointestinal: negative for abdominal pain and change in bowel habits Musculoskeletal:negative for myalgias Neurological: negative  for gait problems and tremors Behavioral/Psych: negative for abusive relationship, depression Endocrine: negative for temperature intolerance   Genitourinary:negative for abnormal menstrual periods, genital lesions, hot flashes, sexual problems and vaginal discharge, +spotting after menses Integument/breast: negative for breast lump, breast tenderness, nipple discharge and skin lesion(s)    Objective:       BP 126/89 mmHg  Pulse 83  Temp(Src) 98.6 F (37 C)  Ht 5\' 7"  (1.702 m)  Wt 183 lb (83.008 kg)  BMI 28.66 kg/m2  LMP 07/09/2014 (Exact Date) General:   alert  Skin:   no rash or abnormalities  Lungs:   clear to auscultation bilaterally  Heart:   regular rate and rhythm, S1, S2 normal, no murmur, click, rub or gallop  Breasts:   normal without suspicious masses, skin or nipple changes or axillary nodes, bilateral nipple piercings no evidence of infection.   Abdomen:  normal findings: no organomegaly, soft, non-tender and no hernia  Pelvis:  External genitalia: normal general appearance, clitoral piercing Urinary system: urethral meatus normal and bladder without fullness, nontender Vaginal: normal without tenderness, induration or masses, + thin gray vaginal discharge, + white chunky discharge Cervix: normal appearance Adnexa: normal bimanual exam Uterus: anteverted and non-tender, normal size   Lab Review Urine pregnancy test Labs reviewed yes Radiologic studies reviewed yes  50% of 30 min visit spent on counseling and coordination of care.   Assessment:    Healthy female exam.    Plan:    Education reviewed: low fat, low cholesterol diet, safe sex/STD prevention, self breast exams, skin cancer screening and weight bearing exercise. Contraception: F/U with skyla insertion. Follow up in: 3 weeks.   Meds ordered this encounter  Medications  . fluconazole (DIFLUCAN) 100 MG tablet    Sig: Take 1 tablet (100 mg total) by mouth once. Repeat dose in 48-72 hour.     Dispense:  3 tablet    Refill:  0  . tinidazole (TINDAMAX) 500 MG tablet    Sig: Take 4 tablets (2,000 mg total) by mouth daily with breakfast.    Dispense:  12 tablet    Refill:  0   Orders Placed This Encounter  Procedures  . HIV antibody (with reflex)  . CBC with Differential/Platelet  . Comprehensive metabolic panel  . TSH  . Cholesterol, total  . Triglycerides  . HDL cholesterol  . Hepatitis B surface antigen  . RPR  . Hepatitis C antibody

## 2014-07-20 ENCOUNTER — Other Ambulatory Visit: Payer: Self-pay | Admitting: Certified Nurse Midwife

## 2014-07-20 LAB — PAP IG W/ RFLX HPV ASCU

## 2014-07-20 LAB — HIV ANTIBODY (ROUTINE TESTING W REFLEX): HIV 1&2 Ab, 4th Generation: NONREACTIVE

## 2014-07-20 LAB — RPR

## 2014-07-20 LAB — HEPATITIS C ANTIBODY: HCV AB: NEGATIVE

## 2014-07-20 LAB — HEPATITIS B SURFACE ANTIGEN: HEP B S AG: NEGATIVE

## 2014-07-24 LAB — SURESWAB, VAGINOSIS/VAGINITIS PLUS
Atopobium vaginae: NOT DETECTED Log (cells/mL)
C. PARAPSILOSIS, DNA: NOT DETECTED
C. TRACHOMATIS RNA, TMA: NOT DETECTED
C. TROPICALIS, DNA: NOT DETECTED
C. albicans, DNA: DETECTED — AB
C. glabrata, DNA: NOT DETECTED
Gardnerella vaginalis: 5.2 Log (cells/mL)
LACTOBACILLUS SPECIES: 7.4 Log (cells/mL)
MEGASPHAERA SPECIES: NOT DETECTED Log (cells/mL)
N. GONORRHOEAE RNA, TMA: NOT DETECTED
T. vaginalis RNA, QL TMA: NOT DETECTED

## 2014-07-27 ENCOUNTER — Other Ambulatory Visit: Payer: Self-pay | Admitting: Certified Nurse Midwife

## 2014-08-02 ENCOUNTER — Encounter (HOSPITAL_COMMUNITY): Payer: Self-pay | Admitting: Emergency Medicine

## 2014-08-02 ENCOUNTER — Emergency Department (INDEPENDENT_AMBULATORY_CARE_PROVIDER_SITE_OTHER)
Admission: EM | Admit: 2014-08-02 | Discharge: 2014-08-02 | Disposition: A | Discharge status: Home / Self Care | Payer: BLUE CROSS/BLUE SHIELD | Source: Home / Self Care | Attending: Family Medicine | Admitting: Family Medicine

## 2014-08-02 DIAGNOSIS — W57XXXA Bitten or stung by nonvenomous insect and other nonvenomous arthropods, initial encounter: Secondary | ICD-10-CM

## 2014-08-02 DIAGNOSIS — S80862A Insect bite (nonvenomous), left lower leg, initial encounter: Secondary | ICD-10-CM

## 2014-08-02 MED ORDER — BETAMETHASONE DIPROPIONATE 0.05 % EX OINT: TOPICAL_OINTMENT | Freq: Two times a day (BID) | CUTANEOUS | Status: DC

## 2014-08-02 NOTE — ED Notes (Signed)
Patient reports insect bites to left lower leg, oozing clear liquid from bumps on leg.

## 2014-08-02 NOTE — ED Provider Notes (Signed)
CSN: [id number]     Arrival date & time 08/02/14  1931 History   First MD Initiated Contact with Patient 08/02/14 1956     Chief Complaint  Patient presents with  . Insect Bite   (Consider location/radiation/quality/duration/timing/severity/associated sxs/prior Treatment) Patient is a 29 y.o. female presenting with rash.  Rash Location:  Leg Leg rash location:  L lower leg and L ankle Quality: blistering, itchiness and redness   Severity:  Mild Onset quality:  Gradual Duration:  3 days Progression:  Unchanged Chronicity:  New Context: insect bite/sting   Relieved by:  None tried Worsened by:  Nothing tried Ineffective treatments:  None tried Associated symptoms: no fever     Past Medical History  Diagnosis Date  . Asthma   . IUD (intrauterine device) in place     Mirena.  States it's been out since June 2015   Past Surgical History  Procedure Laterality Date  . Wisdom tooth extraction     Family History  Problem Relation Age of Onset  . Stroke Mother   . Hypertension Father   . Diabetes Father    History  Substance Use Topics  . Smoking status: Never Smoker   . Smokeless tobacco: Never Used  . Alcohol Use: Yes     Comment: ocassionally beer or wine   OB History    Gravida Para Term Preterm AB TAB SAB Ectopic Multiple Living   1 1 1  0 0 0 0 0 0 1     Review of Systems  Constitutional: Negative.  Negative for fever.  Skin: Positive for rash.    Allergies  Bee venom and Latex  Home Medications   Prior to Admission medications   Medication Sig Start Date End Date Taking? Authorizing Provider  acetaminophen (TYLENOL) 500 MG tablet Take 500 mg by mouth every 6 (six) hours as needed for headache.     Historical Provider, MD  albuterol (PROVENTIL HFA;VENTOLIN HFA) 108 (90 BASE) MCG/ACT inhaler Inhale 2 puffs into the lungs every 6 (six) hours as needed for wheezing or shortness of breath.     Historical Provider, MD  betamethasone dipropionate (DIPROLENE)  0.05 % ointment Apply topically 2 (two) times daily. 08/02/14   Linna HoffJames D Lynora Dymond, MD  diphenhydrAMINE (BENADRYL) 25 MG tablet Take 25-50 mg by mouth every 6 (six) hours as needed for allergies.    Historical Provider, MD  EPINEPHrine (EPIPEN IJ) Inject as directed once.    Historical Provider, MD  fluconazole (DIFLUCAN) 100 MG tablet Take 1 tablet (100 mg total) by mouth once. Repeat dose in 48-72 hour. 07/19/14   Rachelle A Denney, CNM  metroNIDAZOLE (FLAGYL) 500 MG tablet Take 1 tablet (500 mg total) by mouth 2 (two) times daily. Patient not taking: Reported on 07/19/2014 02/22/14   Joycie PeekBenjamin Cartner, PA-C   BP 118/78 mmHg  Pulse 85  Temp(Src) 98.8 F (37.1 C) (Oral)  Resp 16  SpO2 97%  LMP 07/09/2014 (Exact Date) Physical Exam  Constitutional: She is oriented to person, place, and time. She appears well-developed and well-nourished.  Neurological: She is alert and oriented to person, place, and time.  Skin: Skin is warm and dry. Rash noted. There is erythema.  Nursing note and vitals reviewed.   ED Course  Procedures (including critical care time) Labs Review Labs Reviewed - No data to display  Imaging Review No results found.   MDM   1. Insect bite of multiple sites of lower extremity with local reaction, left, initial encounter  Linna Hoff, MD 08/02/14 2006

## 2014-08-08 ENCOUNTER — Telehealth: Payer: Self-pay | Admitting: *Deleted

## 2014-08-08 NOTE — Telephone Encounter (Signed)
Patient is interested in a [place] IUD.  Attempted to contact the patient and left message for patient to call the office.

## 2014-08-11 ENCOUNTER — Ambulatory Visit: Payer: Self-pay | Admitting: Certified Nurse Midwife

## 2014-08-15 NOTE — Telephone Encounter (Signed)
Attempted to contact the patient and left message for patient to contact the office.  

## 2014-09-28 NOTE — Telephone Encounter (Signed)
Attempted to contact the patient and left for patient to call the office.

## 2014-10-31 NOTE — Telephone Encounter (Signed)
Attempted to contact the patient and left message for patient to call the office.  

## 2014-11-08 NOTE — Telephone Encounter (Signed)
Patient returned call to the office. Patient states she is unable to schedule at this time. Patient will talk to her job and call back to schedule. Patient is still interested in the [place].

## 2014-11-16 ENCOUNTER — Other Ambulatory Visit: Payer: Self-pay | Admitting: *Deleted

## 2014-11-16 ENCOUNTER — Telehealth: Payer: Self-pay | Admitting: *Deleted

## 2014-11-16 DIAGNOSIS — N760 Acute vaginitis: Secondary | ICD-10-CM

## 2014-11-16 DIAGNOSIS — B9689 Other specified bacterial agents as the cause of diseases classified elsewhere: Secondary | ICD-10-CM

## 2014-11-16 DIAGNOSIS — B373 Candidiasis of vulva and vagina: Secondary | ICD-10-CM

## 2014-11-16 DIAGNOSIS — B3731 Acute candidiasis of vulva and vagina: Secondary | ICD-10-CM

## 2014-11-16 MED ORDER — METRONIDAZOLE 500 MG PO TABS: 500.0000 mg | ORAL_TABLET | Freq: Two times a day (BID) | ORAL | Status: DC

## 2014-11-16 MED ORDER — FLUCONAZOLE 100 MG PO TABS: 100.0000 mg | ORAL_TABLET | Freq: Once | ORAL | Status: DC

## 2014-11-16 NOTE — Telephone Encounter (Signed)
Patient states she went out of town and changed her soap- now she is having the same symptoms as before and would like a refill. Patient is working and can not come to appointment. Patient advised if her symptoms do not improve to call office for follow up.

## 2015-01-02 ENCOUNTER — Encounter: Payer: Self-pay | Admitting: Obstetrics

## 2015-01-02 ENCOUNTER — Other Ambulatory Visit (INDEPENDENT_AMBULATORY_CARE_PROVIDER_SITE_OTHER): Payer: BLUE CROSS/BLUE SHIELD

## 2015-01-02 VITALS — BP 123/80 | HR 80 | Temp 98.3°F | Wt 189.0 lb

## 2015-01-02 DIAGNOSIS — Z3201 Encounter for pregnancy test, result positive: Secondary | ICD-10-CM | POA: Diagnosis not present

## 2015-01-02 DIAGNOSIS — N926 Irregular menstruation, unspecified: Secondary | ICD-10-CM | POA: Diagnosis not present

## 2015-01-02 LAB — POCT URINE PREGNANCY: Preg Test, Ur: POSITIVE — AB

## 2015-01-02 NOTE — Progress Notes (Signed)
Patient in office today for a pregnancy test. Patient states she missed her period. Patient states she is also feeling nauseous. Pregnancy Test in office today is positive. Patient encouraged to schedule a NOB appointment and prenatal vitamins.   BP 123/80 mmHg  Pulse 80  Temp(Src) 98.3 F (36.8 C)  Wt 189 lb (85.73 kg)  LMP 11/24/2014

## 2015-01-22 NOTE — L&D Delivery Note (Signed)
Delivery Note  This is a 30 year old G 2 P1 who was admitted for SROM. She progressed normally with cytotec, birthtub and 1 dose of Fentanyl to the second stage of labor.  She did not deliver in the birth tub. When nurse entered the room she was involuntarily pushing.  Was out of the tub for about 1 hour.  She pushed for 2 min.  She delivered a viable infant female, cephalic.  A nuchal cord   was not identified. Infant placed on maternal abdomen.  Delayed cord clamping was performed for 15 minutes.  Cord double clamped and cut.  Apgar scores were 8 and 9. The placenta delivered spontaneously, shultz, with a 3 vessel cord.  Inspection revealed 1st degree and preineal. The uterus was firm bleeding stable.  The repair was not required.   EBL was 100.    Placenta and umbilical artery blood gas were not sent.  There were no complications during the procedure.  Mom and baby skin to skin following delivery. Left in stable condition.  Orvilla Cornwall CNM

## 2015-01-30 ENCOUNTER — Encounter: Payer: Self-pay | Admitting: Obstetrics

## 2015-01-30 ENCOUNTER — Ambulatory Visit (INDEPENDENT_AMBULATORY_CARE_PROVIDER_SITE_OTHER): Payer: BLUE CROSS/BLUE SHIELD | Admitting: Obstetrics

## 2015-01-30 VITALS — BP 120/80 | HR 83 | Temp 98.9°F | Wt 196.0 lb

## 2015-01-30 DIAGNOSIS — W57XXXA Bitten or stung by nonvenomous insect and other nonvenomous arthropods, initial encounter: Secondary | ICD-10-CM | POA: Diagnosis not present

## 2015-01-30 DIAGNOSIS — S40861A Insect bite (nonvenomous) of right upper arm, initial encounter: Secondary | ICD-10-CM | POA: Diagnosis not present

## 2015-01-30 NOTE — Progress Notes (Signed)
Patient ID: Katie Riggs, female   DOB: 07-27-1985, 30 y.o.   MRN: [id number]  Chief Complaint  Patient presents with  . Gynecologic Exam    Patient states she has a bud bite on her arm- she has had celulitis in the past and she wants to get it checked. Patient has confirmed her pregnancy- but she had a sharp pain and wonders about ectopic risk.     HPI Katie Riggs is a 30 y.o. female.  Tender, reddened area upper arm.  Occasional abdominal cramping. HPI  Past Medical History  Diagnosis Date  . Asthma   . IUD (intrauterine device) in place     Mirena.  States it's been out since June 2015    Past Surgical History  Procedure Laterality Date  . Wisdom tooth extraction      Family History  Problem Relation Age of Onset  . Stroke Mother   . Hypertension Father   . Diabetes Father     Social History Social History  Substance Use Topics  . Smoking status: Never Smoker   . Smokeless tobacco: Never Used  . Alcohol Use: No     Comment: ocassionally beer or wine    Allergies  Allergen Reactions  . Bee Venom Anaphylaxis  . Latex Anaphylaxis and Rash    Current Outpatient Prescriptions  Medication Sig Dispense Refill  . acetaminophen (TYLENOL) 500 MG tablet Take 500 mg by mouth every 6 (six) hours as needed for headache.     . albuterol (PROVENTIL HFA;VENTOLIN HFA) 108 (90 BASE) MCG/ACT inhaler Inhale 2 puffs into the lungs every 6 (six) hours as needed for wheezing or shortness of breath.     . betamethasone dipropionate (DIPROLENE) 0.05 % ointment Apply topically 2 (two) times daily. 15 g 1  . diphenhydrAMINE (BENADRYL) 25 MG tablet Take 25-50 mg by mouth every 6 (six) hours as needed for allergies.    Marland Kitchen. EPINEPHrine (EPIPEN IJ) Inject as directed once.     No current facility-administered medications for this visit.    Review of Systems Review of Systems Constitutional: negative for fatigue and weight loss Respiratory: negative for cough and  wheezing Cardiovascular: negative for chest pain, fatigue and palpitations Gastrointestinal: negative for abdominal pain and change in bowel habits Genitourinary:negative Integument/breast: positive for painful small cellulitis RUE from insect bite Musculoskeletal:negative for myalgias Neurological: negative for gait problems and tremors Behavioral/Psych: negative for abusive relationship, depression Endocrine: negative for temperature intolerance     Blood pressure 120/80, pulse 83, temperature 98.9 F (37.2 C), weight 196 lb (88.905 kg), last menstrual period 11/24/2014.  Physical Exam Physical Exam    PE:        General:  Alert and no distress      Right upper arm:  ~ 4140m area slightly raised and erythematous, tender, no discharge.                                                                                                                                                                                                                                                          **  Data Reviewed Vital signs  Assessment     Insect bite RUE.  Stable, uninfected, resolving.     Plan    Apply antibiotic ointment and hydrocortisone bid.  F/U 1 week  No orders of the defined types were placed in this encounter.   No orders of the defined types were placed in this encounter.

## 2015-02-07 ENCOUNTER — Other Ambulatory Visit: Payer: Self-pay | Admitting: Certified Nurse Midwife

## 2015-02-07 ENCOUNTER — Ambulatory Visit (INDEPENDENT_AMBULATORY_CARE_PROVIDER_SITE_OTHER): Payer: BLUE CROSS/BLUE SHIELD | Admitting: Certified Nurse Midwife

## 2015-02-07 ENCOUNTER — Encounter: Payer: Self-pay | Admitting: Certified Nurse Midwife

## 2015-02-07 VITALS — BP 126/88 | HR 109 | Temp 98.2°F | Wt 196.0 lb

## 2015-02-07 DIAGNOSIS — Z3481 Encounter for supervision of other normal pregnancy, first trimester: Secondary | ICD-10-CM

## 2015-02-07 DIAGNOSIS — Z3687 Encounter for antenatal screening for uncertain dates: Secondary | ICD-10-CM

## 2015-02-07 DIAGNOSIS — N760 Acute vaginitis: Secondary | ICD-10-CM

## 2015-02-07 DIAGNOSIS — O219 Vomiting of pregnancy, unspecified: Secondary | ICD-10-CM

## 2015-02-07 DIAGNOSIS — Z3480 Encounter for supervision of other normal pregnancy, unspecified trimester: Secondary | ICD-10-CM | POA: Insufficient documentation

## 2015-02-07 LAB — POCT URINALYSIS DIPSTICK
Bilirubin, UA: NEGATIVE
GLUCOSE UA: NEGATIVE
KETONES UA: NEGATIVE
LEUKOCYTES UA: NEGATIVE
Nitrite, UA: NEGATIVE
Protein, UA: NEGATIVE
Spec Grav, UA: 1.02
Urobilinogen, UA: NEGATIVE
pH, UA: 5

## 2015-02-07 MED ORDER — PROMETHAZINE HCL 50 MG PO TABS: 50.0000 mg | ORAL_TABLET | Freq: Four times a day (QID) | ORAL | Status: DC | PRN

## 2015-02-07 MED ORDER — FLUCONAZOLE 100 MG PO TABS: 100.0000 mg | ORAL_TABLET | Freq: Once | ORAL | Status: DC

## 2015-02-07 MED ORDER — PRENATE PIXIE 10-0.6-0.4-200 MG PO CAPS
1.0000 | ORAL_CAPSULE | Freq: Every day | ORAL | Status: DC
Start: 2015-02-07 — End: 2017-08-22

## 2015-02-07 MED ORDER — METRONIDAZOLE 0.75 % VA GEL: 1.0000 | Freq: Two times a day (BID) | VAGINAL | Status: DC

## 2015-02-07 MED ORDER — METOCLOPRAMIDE HCL 10 MG PO TABS: 10.0000 mg | ORAL_TABLET | Freq: Four times a day (QID) | ORAL | Status: DC

## 2015-02-07 NOTE — Progress Notes (Signed)
Subjective:    Katie Riggs is being seen today for her first obstetrical visit.  This is not a planned pregnancy. She is at [redacted]w[redacted]d gestation. Her obstetrical history is significant for none. Relationship with FOB: significant other, living together. Patient does intend to breast feed, breast fed before for one year. Pregnancy history fully reviewed.  The information documented in the HPI was reviewed and verified.  Menstrual History: OB History    Gravida Para Term Preterm AB TAB SAB Ectopic Multiple Living   0 0 0 0 0 0 1     Hx of term 7#11oz infant  Menarche age: 30 years of age.   Patient's last menstrual period was 11/24/2014.    Past Medical History  Diagnosis Date  . Asthma   . IUD (intrauterine device) in place     Mirena.  States it's been out since June 2015    Past Surgical History  Procedure Laterality Date  . Wisdom tooth extraction       (Not in a hospital admission) Allergies  Allergen Reactions  . Bee Venom Anaphylaxis  . Latex Anaphylaxis and Rash    Social History  Substance Use Topics  . Smoking status: Never Smoker   . Smokeless tobacco: Never Used  . Alcohol Use: No     Comment: ocassionally beer or wine    Family History  Problem Relation Age of Onset  . Stroke Mother   . Hypertension Father   . Diabetes Father      Review of Systems Constitutional: negative for weight loss Gastrointestinal: negative for vomiting, + nausea Genitourinary:negative for genital lesions and vaginal discharge and dysuria Musculoskeletal:negative for back pain Behavioral/Psych: negative for abusive relationship, depression, illegal drug usage and tobacco use    Objective:    BP 126/88 mmHg  Pulse 109  Temp(Src) 98.2 F (36.8 C)  Wt 196 lb (88.905 kg)  LMP 11/24/2014 General Appearance:    Alert, cooperative, no distress, appears stated age  Head:    Normocephalic, without obvious abnormality, atraumatic  Eyes:    PERRL, conjunctiva/corneas  clear, EOM's intact, fundi    benign, both eyes  Ears:    Normal TM's and external ear canals, both ears  Nose:   Nares normal, septum midline, mucosa normal, no drainage    or sinus tenderness  Throat:   Lips, mucosa, and tongue normal; teeth and gums normal  Neck:   Supple, symmetrical, trachea midline, no adenopathy;    thyroid:  no enlargement/tenderness/nodules; no carotid   bruit or JVD  Back:     Symmetric, no curvature, ROM normal, no CVA tenderness  Lungs:     Clear to auscultation bilaterally, respirations unlabored  Chest Wall:    No tenderness or deformity   Heart:    Regular rate and rhythm, S1 and S2 normal, no murmur, rub   or gallop  Breast Exam:    No tenderness, masses, or nipple abnormality  Abdomen:     Soft, non-tender, bowel sounds active all four quadrants,    no masses, no organomegaly  Genitalia:    Normal female without lesion, or tenderness. + gray thin   discharge  Extremities:   Extremities normal, atraumatic, no cyanosis or edema  Pulses:   2+ and symmetric all extremities  Skin:   Skin color, texture, turgor normal, no rashes or lesions  Lymph nodes:   Cervical, supraclavicular, and axillary nodes normal  Neurologic:   CNII-XII intact, normal strength, sensation and reflexes  throughout    Cervix:  Long, thick, closed and posterior.                              FHR: seen on bedside US    Lab Review Urine pregnancy test Labs reviewed yes Radiologic studies reviewed no Assessment:    Pregnancy at [redacted]w[redacted]d weeks weeks   Unsure of LMP  BV  N&V in early pregnancy  Plan:      Prenatal vitamins.  Counseling provided regarding continued use of seat belts, cessation of alcohol consumption, smoking or use of illicit drugs; infection precautions i.e., influenza/TDAP immunizations, toxoplasmosis,CMV, parvovirus, listeria and varicella; workplace safety, exercise during pregnancy; routine dental care, safe medications, sexual activity, hot tubs, saunas, pools,  travel, caffeine use, fish and methlymercury, potential toxins, hair treatments, varicose veins Weight gain recommendations per IOM guidelines reviewed: underweight/BMI< 18.5--> gain 28 - 40 lbs; normal weight/BMI 18.5 - 24.9--> gain 25 - 35 lbs; overweight/BMI 25 - 29.9--> gain 15 - 25 lbs; obese/BMI >30->gain  11 - 20 lbs Problem list reviewed and updated. FIRST/CF mutation testing/NIPT/QUAD SCREEN/fragile X/Ashkenazi Jewish population testing/Spinal muscular atrophy discussed: requested. Role of ultrasound in pregnancy discussed; fetal survey: requested. Amniocentesis discussed: not indicated. VBAC calculator score: VBAC consent form provided No orders of the defined types were placed in this encounter.   Orders Placed This Encounter  Procedures  . Culture, OB Urine  . SureSwab, Vaginosis/Vaginitis Plus  . Obstetric panel  . HIV antibody  . Hemoglobinopathy evaluation  . Varicella zoster antibody, IgG  . VITAMIN D 25 Hydroxy (Vit-D Deficiency, Fractures)  . TSH  . POCT urinalysis dipstick    Follow up in 4 weeks. 50% of 30 min visit spent on counseling and coordination of care.

## 2015-02-08 LAB — OBSTETRIC PANEL
ANTIBODY SCREEN: NEGATIVE
BASOS ABS: 0 10*3/uL (ref 0.0–0.1)
Basophils Relative: 0 % (ref 0–1)
Eosinophils Absolute: 0.1 10*3/uL (ref 0.0–0.7)
Eosinophils Relative: 1 % (ref 0–5)
HEMATOCRIT: 39.5 % (ref 36.0–46.0)
HEMOGLOBIN: 13.2 g/dL (ref 12.0–15.0)
HEP B S AG: NEGATIVE
LYMPHS ABS: 1.7 10*3/uL (ref 0.7–4.0)
Lymphocytes Relative: 25 % (ref 12–46)
MCH: 28.6 pg (ref 26.0–34.0)
MCHC: 33.4 g/dL (ref 30.0–36.0)
MCV: 85.5 fL (ref 78.0–100.0)
MONOS PCT: 11 % (ref 3–12)
MPV: 9.9 fL (ref 8.6–12.4)
Monocytes Absolute: 0.8 10*3/uL (ref 0.1–1.0)
NEUTROS ABS: 4.3 10*3/uL (ref 1.7–7.7)
NEUTROS PCT: 63 % (ref 43–77)
Platelets: 339 10*3/uL (ref 150–400)
RBC: 4.62 MIL/uL (ref 3.87–5.11)
RDW: 13.6 % (ref 11.5–15.5)
Rh Type: POSITIVE
Rubella: 11.5 Index — ABNORMAL HIGH (ref <0.90)
WBC: 6.9 10*3/uL (ref 4.0–10.5)

## 2015-02-08 LAB — VITAMIN D 25 HYDROXY (VIT D DEFICIENCY, FRACTURES): VIT D 25 HYDROXY: 8 ng/mL — AB (ref 30–100)

## 2015-02-08 LAB — HIV ANTIBODY (ROUTINE TESTING W REFLEX): HIV 1&2 Ab, 4th Generation: NONREACTIVE

## 2015-02-08 LAB — TSH: TSH: 0.05 u[IU]/mL — AB (ref 0.350–4.500)

## 2015-02-08 LAB — VARICELLA ZOSTER ANTIBODY, IGG: VARICELLA IGG: 1655 {index} — AB (ref <135.00)

## 2015-02-09 ENCOUNTER — Ambulatory Visit (INDEPENDENT_AMBULATORY_CARE_PROVIDER_SITE_OTHER): Payer: BLUE CROSS/BLUE SHIELD

## 2015-02-09 DIAGNOSIS — Z36 Encounter for antenatal screening of mother: Secondary | ICD-10-CM | POA: Diagnosis not present

## 2015-02-09 DIAGNOSIS — Z3687 Encounter for antenatal screening for uncertain dates: Secondary | ICD-10-CM

## 2015-02-09 DIAGNOSIS — Z3481 Encounter for supervision of other normal pregnancy, first trimester: Secondary | ICD-10-CM

## 2015-02-09 LAB — HEMOGLOBINOPATHY EVALUATION
HGB A: 97.3 % (ref 96.8–97.8)
HGB F QUANT: 0 % (ref 0.0–2.0)
HGB S QUANTITAION: 0 %
Hemoglobin Other: 0 %
Hgb A2 Quant: 2.7 % (ref 2.2–3.2)

## 2015-02-09 LAB — CULTURE, OB URINE
Colony Count: NO GROWTH
Organism ID, Bacteria: NO GROWTH

## 2015-02-11 ENCOUNTER — Other Ambulatory Visit: Payer: Self-pay | Admitting: Certified Nurse Midwife

## 2015-02-11 DIAGNOSIS — R7989 Other specified abnormal findings of blood chemistry: Secondary | ICD-10-CM

## 2015-02-11 LAB — SURESWAB, VAGINOSIS/VAGINITIS PLUS
ATOPOBIUM VAGINAE: NOT DETECTED Log (cells/mL)
C. ALBICANS, DNA: DETECTED — AB
C. GLABRATA, DNA: NOT DETECTED
C. PARAPSILOSIS, DNA: NOT DETECTED
C. TRACHOMATIS RNA, TMA: NOT DETECTED
C. tropicalis, DNA: NOT DETECTED
Gardnerella vaginalis: NOT DETECTED Log (cells/mL)
LACTOBACILLUS SPECIES: 7.7 Log (cells/mL)
MEGASPHAERA SPECIES: NOT DETECTED Log (cells/mL)
N. gonorrhoeae RNA, TMA: NOT DETECTED
T. VAGINALIS RNA, QL TMA: NOT DETECTED

## 2015-02-11 LAB — THYROID PROFILE - CHCC
Free Thyroxine Index: 2.6 (ref 1.4–3.8)
T3 UPTAKE: 20 % — AB (ref 22–35)
T4, Total: 13 ug/dL — ABNORMAL HIGH (ref 4.5–12.0)

## 2015-02-13 ENCOUNTER — Encounter: Payer: Self-pay | Admitting: Certified Nurse Midwife

## 2015-02-15 ENCOUNTER — Other Ambulatory Visit: Payer: Self-pay | Admitting: Certified Nurse Midwife

## 2015-02-17 ENCOUNTER — Encounter: Payer: Self-pay | Admitting: Internal Medicine

## 2015-02-17 ENCOUNTER — Ambulatory Visit (INDEPENDENT_AMBULATORY_CARE_PROVIDER_SITE_OTHER): Payer: BLUE CROSS/BLUE SHIELD | Admitting: Internal Medicine

## 2015-02-17 VITALS — BP 104/62 | HR 111 | Temp 97.7°F | Resp 12 | Ht 67.5 in | Wt 197.0 lb

## 2015-02-17 DIAGNOSIS — E0590 Thyrotoxicosis, unspecified without thyrotoxic crisis or storm: Secondary | ICD-10-CM | POA: Diagnosis not present

## 2015-02-17 DIAGNOSIS — E0580 Other thyrotoxicosis without thyrotoxic crisis or storm: Secondary | ICD-10-CM

## 2015-02-17 LAB — TSH: TSH: 0.27 u[IU]/mL — ABNORMAL LOW (ref 0.35–4.50)

## 2015-02-17 LAB — T3, FREE: T3, Free: 3.4 pg/mL (ref 2.3–4.2)

## 2015-02-17 LAB — T4, FREE: Free T4: 0.74 ng/dL (ref 0.60–1.60)

## 2015-02-17 NOTE — Patient Instructions (Signed)
Please look up Gestational Transient Thyrotoxicosis.   Please stop at the lab.  Please come back for a follow-up appointment in 3 months.

## 2015-02-17 NOTE — Progress Notes (Signed)
Patient ID: Katie Riggs, female   DOB: [birth year], 30 y.o.   MRN: [id number]   HPI  Katie Riggs is a 30 y.o.-year-old female, referred by Orvilla Cornwall, CNM, for evaluation and management of  thyrotoxicosis. She is 12-week pregnant.  Patient was recently found to have a low TSH along with a slightly high total T4 during her initial pregnancy evaluation by her OB/GYN provider.  I reviewed pt's thyroid tests: Lab Results  Component Value Date   TSH 0.050* 02/07/2015   TSH 0.534 07/19/2014    Component     Latest Ref Rng 02/07/2015  T4, Total     4.5 - 12.0 ug/dL 40.9 (H)  T3 Uptake     22 - 35 % 20 (L)  Free Thyroxine Index     1.4 - 3.8 2.6   This is her second child >> 2008 >> reportedlynormal TFTs then.  Pt denies feeling nodules in neck, hoarseness, dysphagia/odynophagia, SOB with lying down; she c/o: - + weight gain: 10 lbs in last 3 months - some fatigue, not too pronounced - no excessive sweating/heat intolerance - no tremors - no anxiety - no palpitations - no hyperdefecation - no hair loss  Pt does not have a FH of thyroid ds. No FH of thyroid cancer. No h/o radiation tx to head or neck.  No seaweed or kelp, no recent contrast studies. No steroid use. No herbal supplements. No Biotin use.  ROS: Constitutional: + weight gain, slight fatigue, no subjective hyperthermia/hypothermia, + nocturia, + poor sleep Eyes: no blurry vision, no xerophthalmia ENT: no sore throat, no nodules palpated in throat, no dysphagia/odynophagia, no hoarseness Cardiovascular: no CP/palpitations/leg swelling Respiratory: no cough/+ SOB Gastrointestinal: no N/V/D/+ C Musculoskeletal: no muscle/joint aches Skin: no rashes Neurological: no tremors/numbness/tingling/dizziness Psychiatric: no depression/anxiety  Past Medical History  Diagnosis Date  . Asthma   . IUD (intrauterine device) in place     Mirena.  States it's been out since June 2015   Past Surgical  History  Procedure Laterality Date  . Wisdom tooth extraction     Social History   Social History  . Marital Status: Single    Spouse Name: N/A  . Number of Children: 1   Occupational History  . Customer care specialist   Social History Main Topics  . Smoking status: Never Smoker   . Smokeless tobacco: Never Used  . Alcohol Use: No     Comment: ocassionally beer or wine  . Drug Use: No  . Sexual Activity:    Partners: Male    Birth Control/ Protection: None   Current Outpatient Prescriptions on File Prior to Visit  Medication Sig Dispense Refill  . acetaminophen (TYLENOL) 500 MG tablet Take 500 mg by mouth every 6 (six) hours as needed for headache.     . diphenhydrAMINE (BENADRYL) 25 MG tablet Take 25-50 mg by mouth every 6 (six) hours as needed for allergies.    Marland Kitchen EPINEPHrine (EPIPEN IJ) Inject as directed once.    . Prenat-FeAsp-Meth-FA-DHA w/o A (PRENATE PIXIE) 10-0.6-0.4-200 MG CAPS Take 1 tablet by mouth daily. 30 capsule 12  . fluconazole (DIFLUCAN) 100 MG tablet Take 1 tablet (100 mg total) by mouth once. Repeat dose in 48-72 hour. (Patient not taking: Reported on 02/17/2015) 3 tablet 0  . metoCLOPramide (REGLAN) 10 MG tablet Take 1 tablet (10 mg total) by mouth 4 (four) times daily. (Patient not taking: Reported on 02/17/2015) 120 tablet 4  . metroNIDAZOLE (METROGEL VAGINAL) 0.75 % vaginal  gel Place 1 Applicatorful vaginally 2 (two) times daily. (Patient not taking: Reported on 02/17/2015) 70 g 0  . promethazine (PHENERGAN) 50 MG tablet Take 1 tablet (50 mg total) by mouth every 6 (six) hours as needed for nausea or vomiting. (Patient not taking: Reported on 02/17/2015) 30 tablet 0   No current facility-administered medications on file prior to visit.   Allergies  Allergen Reactions  . Bee Venom Anaphylaxis  . Latex Anaphylaxis and Rash   Family History  Problem Relation Age of Onset  . Stroke Mother   . Hypertension Father   . Diabetes Father    PE: BP 104/62  mmHg  Pulse 111  Temp(Src) 97.7 F (36.5 C) (Oral)  Resp 12  Ht 5' 7.5" (1.715 m)  Wt 197 lb (89.359 kg)  BMI 30.38 kg/m2  SpO2 98%  LMP 11/24/2014 Wt Readings from Last 3 Encounters:  02/17/15 197 lb (89.359 kg)  02/07/15 196 lb (88.905 kg)  01/30/15 196 lb (88.905 kg)   Constitutional: overweight, in NAD Eyes: PERRLA, EOMI, no exophthalmos, no lid lag, no stare ENT: moist mucous membranes, + symmetric thyromegaly, no thyroid bruits, no cervical lymphadenopathy Cardiovascular: tachycardia, RR, No MRG Respiratory: CTA B Gastrointestinal: abdomen soft, NT, ND, BS+ Musculoskeletal: no deformities, strength intact in all 4 Skin: moist, warm, no rashes Neurological: no tremor with outstretched hands, DTR normal in all 4  ASSESSMENT: 1. Thyrotoxicosis - during pregnancy  PLAN:  1. Patient with a recently found low TSH, along with a slightly high total T4, without thyrotoxic sxs: No weight loss, heat intolerance, hyperdefecation, palpitations, anxiety. However, she has a high pulse, which could be due to pregnancy, but could also be due to thyrotoxicosis. We discussed about possible causes for thyrotoxicosis: - Exogenous causes (steroids, biotin, other medicines like amiodarone, iodine supplements, herbal supplements) - Pregnancy: she is [redacted] weeks pregnant, period in which beta hCG is high, and this can stimulate the thyroid and produce a thyrotoxic picture. This condition is called gestational transient thyrotoxicosis (GTT) and is usually self-limiting with the progression of the pregnancy and does not cause problems in either mother or baby, unless very severe. Her TSH is low, but detectable, and total T4 is only slightly above the upper limit of normal. - Other possible etiologies: Graves ds   Thyroiditis toxic multinodular goiter/ toxic adenoma (I cannot feel nodules at palpation of her thyroid, however this is enlarged symmetrically) - I suggested that we check the TSH, fT3 and  fT4 and also add thyroid stimulating antibodies to screen for Graves' disease.  - I did advice her that we might need to do thyroid ultrasound depending on the results, but I doubt we need to do this during the pregnancy - We may need to add beta blockers when the results are back, if she is thyrotoxic. - RTC in 3 months, but likely sooner for repeat labs  Office Visit on 02/17/2015  Component Date Value Ref Range Status  . TSH 02/17/2015 0.27* 0.35 - 4.50 uIU/mL Final  . Free T4 02/17/2015 0.74  0.60 - 1.60 ng/dL Final  . T3, Free 16/10/9602 3.4  2.3 - 4.2 pg/mL Final   Suspect GTT  - message sent:  Dear Ms Felisa Bonier news: The TSH is MUCH better, now slightly under the lower limit of normal, and the rest of the thyroid hormones are normal! The antibodies are not back yet, but I just wanted to share the good news. Sincerely, Carlus Pavlov MD

## 2015-02-21 LAB — THYROID STIMULATING IMMUNOGLOBULIN: TSI: 102 %{baseline} (ref <140)

## 2015-03-07 ENCOUNTER — Ambulatory Visit (INDEPENDENT_AMBULATORY_CARE_PROVIDER_SITE_OTHER): Payer: BLUE CROSS/BLUE SHIELD | Admitting: Certified Nurse Midwife

## 2015-03-07 ENCOUNTER — Encounter: Payer: Self-pay | Admitting: Certified Nurse Midwife

## 2015-03-07 VITALS — BP 120/79 | HR 99 | Temp 97.6°F | Wt 199.0 lb

## 2015-03-07 DIAGNOSIS — O0992 Supervision of high risk pregnancy, unspecified, second trimester: Secondary | ICD-10-CM

## 2015-03-07 LAB — POCT URINALYSIS DIPSTICK
Bilirubin, UA: NEGATIVE
Blood, UA: NEGATIVE
Glucose, UA: NEGATIVE
Ketones, UA: NEGATIVE
LEUKOCYTES UA: NEGATIVE
NITRITE UA: NEGATIVE
PH UA: 5
PROTEIN UA: NEGATIVE
Spec Grav, UA: 1.015
Urobilinogen, UA: NEGATIVE

## 2015-03-07 NOTE — Progress Notes (Signed)
  Subjective:    Katie Riggs is a 30 y.o. female being seen today for her obstetrical visit. She is at [redacted]w[redacted]d gestation. Patient reports: no complaints.  Patient is feeling fetal movement.  Has been to endocrinology, see note, awaiting lab results, may need beta blocker later in pregnancy.  F/U with endocrinology in 3 months.    Problem List Items Addressed This Visit      Other   Supervision of other normal pregnancy, antepartum - Primary     Patient Active Problem List   Diagnosis Date Noted  . Thyrotoxicosis 02/17/2015  . Supervision of other normal pregnancy, antepartum 02/07/2015  . Vaginal discharge 02/19/2012    Objective:     BP 120/79 mmHg  Pulse 99  Temp(Src) 97.6 F (36.4 C)  Wt 199 lb (90.266 kg)  LMP 11/24/2014 Uterine Size: Below umbilicus   FHR: 150-160's by doppler, +FM auscultated.    Assessment:    Pregnancy @ [redacted]w[redacted]d  weeks Doing well    Plan:    Problem list reviewed and updated. Labs reviewed.  Follow up in 4 weeks. FIRST/CF mutation testing/NIPT/QUAD SCREEN/fragile X/Ashkenazi Jewish population testing/Spinal muscular atrophy discussed: ordered. Role of ultrasound in pregnancy discussed; fetal survey: ordered. Amniocentesis discussed: not indicated. 50% of 15 minute visit spent on counseling and coordination of care.

## 2015-03-10 ENCOUNTER — Other Ambulatory Visit: Payer: Self-pay | Admitting: Certified Nurse Midwife

## 2015-03-10 ENCOUNTER — Encounter: Payer: Self-pay | Admitting: Certified Nurse Midwife

## 2015-03-10 LAB — AFP, QUAD SCREEN
AFP: 31.6 ng/mL
Age Alone: 1:769 {titer}
CURR GEST AGE: 15.1 wks.days
Down Syndrome Scr Risk Est: 1:6660 {titer}
HCG, Total: 50.43 IU/mL
INH: 121.8 pg/mL
Interpretation-AFP: NEGATIVE
MOM FOR INH: 0.75
MoM for AFP: 1.1
MoM for hCG: 1.16
Open Spina bifida: NEGATIVE
Tri 18 Scr Risk Est: NEGATIVE
Trisomy 18 (Edward) Syndrome Interp.: 1:37900 {titer}
UE3 MOM: 0.77
uE3 Value: 0.48 ng/mL

## 2015-03-29 ENCOUNTER — Encounter (HOSPITAL_COMMUNITY): Payer: Self-pay | Admitting: Certified Nurse Midwife

## 2015-04-04 ENCOUNTER — Encounter (HOSPITAL_COMMUNITY): Payer: Self-pay

## 2015-04-04 ENCOUNTER — Other Ambulatory Visit: Payer: Self-pay | Admitting: Certified Nurse Midwife

## 2015-04-04 ENCOUNTER — Ambulatory Visit (HOSPITAL_COMMUNITY)
Admission: RE | Admit: 2015-04-04 | Discharge: 2015-04-04 | Disposition: A | Discharge status: Home / Self Care | Payer: BLUE CROSS/BLUE SHIELD | Source: Ambulatory Visit | Attending: Certified Nurse Midwife | Admitting: Certified Nurse Midwife

## 2015-04-04 ENCOUNTER — Ambulatory Visit (INDEPENDENT_AMBULATORY_CARE_PROVIDER_SITE_OTHER): Payer: BLUE CROSS/BLUE SHIELD | Admitting: Certified Nurse Midwife

## 2015-04-04 VITALS — BP 131/80 | HR 92 | Wt 203.0 lb

## 2015-04-04 VITALS — BP 109/72 | HR 92 | Temp 98.2°F | Wt 204.0 lb

## 2015-04-04 DIAGNOSIS — Z3A18 18 weeks gestation of pregnancy: Secondary | ICD-10-CM | POA: Diagnosis not present

## 2015-04-04 DIAGNOSIS — Z3482 Encounter for supervision of other normal pregnancy, second trimester: Secondary | ICD-10-CM

## 2015-04-04 DIAGNOSIS — Z36 Encounter for antenatal screening of mother: Secondary | ICD-10-CM | POA: Insufficient documentation

## 2015-04-04 DIAGNOSIS — O99282 Endocrine, nutritional and metabolic diseases complicating pregnancy, second trimester: Secondary | ICD-10-CM | POA: Diagnosis not present

## 2015-04-04 DIAGNOSIS — IMO0001 Reserved for inherently not codable concepts without codable children: Secondary | ICD-10-CM

## 2015-04-04 DIAGNOSIS — Z3689 Encounter for other specified antenatal screening: Secondary | ICD-10-CM

## 2015-04-04 DIAGNOSIS — O99212 Obesity complicating pregnancy, second trimester: Secondary | ICD-10-CM | POA: Diagnosis not present

## 2015-04-04 DIAGNOSIS — IMO0002 Reserved for concepts with insufficient information to code with codable children: Secondary | ICD-10-CM

## 2015-04-04 DIAGNOSIS — O0992 Supervision of high risk pregnancy, unspecified, second trimester: Secondary | ICD-10-CM

## 2015-04-04 DIAGNOSIS — O358XX0 Maternal care for other (suspected) fetal abnormality and damage, not applicable or unspecified: Secondary | ICD-10-CM

## 2015-04-04 DIAGNOSIS — Z0489 Encounter for examination and observation for other specified reasons: Secondary | ICD-10-CM

## 2015-04-04 LAB — POCT URINALYSIS DIPSTICK
Bilirubin, UA: NEGATIVE
Glucose, UA: NEGATIVE
Ketones, UA: NEGATIVE
Leukocytes, UA: NEGATIVE
Nitrite, UA: NEGATIVE
Protein, UA: NEGATIVE
RBC UA: NEGATIVE
SPEC GRAV UA: 1.01
UROBILINOGEN UA: NEGATIVE
pH, UA: 7

## 2015-04-04 NOTE — Progress Notes (Signed)
Subjective:    Katie LouisBrittany C Marez is a 30 y.o. female being seen today for her obstetrical visit. She is at 3459w5d gestation. Patient reports: no complaints . Fetal movement: normal.  Problem List Items Addressed This Visit      Other   Supervision of other normal pregnancy, antepartum - Primary   Relevant Orders   POCT urinalysis dipstick (Completed)   AFP, Quad Screen     Patient Active Problem List   Diagnosis Date Noted  . Thyrotoxicosis 02/17/2015  . Supervision of other normal pregnancy, antepartum 02/07/2015  . Vaginal discharge 02/19/2012   Objective:    BP 109/72 mmHg  Pulse 92  Temp(Src) 98.2 F (36.8 C)  Wt 204 lb (92.534 kg)  LMP 11/24/2014 FHT: 150 BPM  Uterine Size: size equals dates     Assessment:    Pregnancy @ 6259w5d    Plan:    OBGCT: discussed. Signs and symptoms of preterm labor: discussed.  Labs, problem list reviewed and updated 2 hr GTT planned Follow up in 4 weeks.

## 2015-04-07 ENCOUNTER — Inpatient Hospital Stay (HOSPITAL_COMMUNITY)
Admission: AD | Admit: 2015-04-07 | Discharge: 2015-04-07 | Disposition: A | Discharge status: Home / Self Care | Payer: BLUE CROSS/BLUE SHIELD | Source: Ambulatory Visit | Attending: Obstetrics | Admitting: Obstetrics

## 2015-04-07 DIAGNOSIS — Z3A19 19 weeks gestation of pregnancy: Secondary | ICD-10-CM | POA: Insufficient documentation

## 2015-04-07 DIAGNOSIS — O99891 Other specified diseases and conditions complicating pregnancy: Secondary | ICD-10-CM

## 2015-04-07 DIAGNOSIS — O26892 Other specified pregnancy related conditions, second trimester: Secondary | ICD-10-CM | POA: Insufficient documentation

## 2015-04-07 DIAGNOSIS — O9989 Other specified diseases and conditions complicating pregnancy, childbirth and the puerperium: Secondary | ICD-10-CM

## 2015-04-07 DIAGNOSIS — K047 Periapical abscess without sinus: Secondary | ICD-10-CM | POA: Diagnosis not present

## 2015-04-07 MED ORDER — AMOXICILLIN 500 MG PO CAPS: 500.0000 mg | ORAL_CAPSULE | Freq: Three times a day (TID) | ORAL | Status: DC

## 2015-04-07 MED ORDER — TRAMADOL HCL 50 MG PO TABS: 50.0000 mg | ORAL_TABLET | Freq: Four times a day (QID) | ORAL | Status: DC | PRN

## 2015-04-07 NOTE — MAU Note (Signed)
About 2000 had gum abscess that burst. Alittle while afterward started feeling flush and temp had dropped alittle. Went on goggle and afraid I am getting septic. Just felt kinda weird after it burst

## 2015-04-07 NOTE — Progress Notes (Signed)
Written and verbal d/c instructions given and understanding voiced. 

## 2015-04-07 NOTE — Discharge Instructions (Signed)

## 2015-04-07 NOTE — MAU Note (Signed)
Urine sent to lab 

## 2015-04-07 NOTE — MAU Note (Signed)
Ivonne AndrewV. SMith CNM in Triage to talk with pt and make d/c plan.

## 2015-04-07 NOTE — MAU Provider Note (Signed)
Chief Complaint: Abscess   First Provider Initiated Contact with Patient 04/07/15 2335     SUBJECTIVE HPI: Katie Riggs is a 30 y.o. G2P1001 at 6668w1d who presents to [facility] reporting having a dental abscess rupture tonight followed by feeling flushed and a little sick afterward. Still feels like gum is swollen and has not drained completely. Worried that she may be septic. Was reading about abscesses on [employer].   Location: Left upper gum Quality: throbbing Severity: Moderate Duration: several days Context: Dental abscess Timing: constant Modifying factors: Slightly improvement after rupture Associated signs and symptoms: Neg fore fever, chills, facial swelling.   Past Medical History  Diagnosis Date  . Asthma   . IUD (intrauterine device) in place     Mirena.  States it's been out since June 2015   OB History  Gravida Para Term Preterm AB SAB TAB Ectopic Multiple Living  2 1 1  0 0 0 0 0 0 1    # Outcome Date GA Lbr Len/2nd Weight Sex Delivery Anes PTL Lv  2 Current           1 Term 11/25/06 382w0d  7 lb 11 oz (3.487 kg) F Vag-Spont None N Y     Past Surgical History  Procedure Laterality Date  . Wisdom tooth extraction     Social History   Social History  . Marital Status: Single    Spouse Name: N/A  . Number of Children: N/A  . Years of Education: N/A   Occupational History  . Not on file.   Social History Main Topics  . Smoking status: Never Smoker   . Smokeless tobacco: Never Used  . Alcohol Use: No     Comment: ocassionally beer or wine  . Drug Use: No  . Sexual Activity:    Partners: Male    Birth Control/ Protection: None   Other Topics Concern  . Not on file   Social History Narrative   No current facility-administered medications on file prior to encounter.   Current Outpatient Prescriptions on File Prior to Encounter  Medication Sig Dispense Refill  . acetaminophen (TYLENOL) 500 MG tablet Take 500 mg by mouth every 6 (six)  hours as needed for headache.     . diphenhydrAMINE (BENADRYL) 25 MG tablet Take 25-50 mg by mouth every 6 (six) hours as needed for allergies.    Marland Kitchen. EPINEPHrine (EPIPEN IJ) Inject as directed once.    . metoCLOPramide (REGLAN) 10 MG tablet Take 1 tablet (10 mg total) by mouth 4 (four) times daily. 120 tablet 4  . Prenat-FeAsp-Meth-FA-DHA w/o A (PRENATE PIXIE) 10-0.6-0.4-200 MG CAPS Take 1 tablet by mouth daily. 30 capsule 12  . promethazine (PHENERGAN) 50 MG tablet Take 1 tablet (50 mg total) by mouth every 6 (six) hours as needed for nausea or vomiting. 30 tablet 0   Allergies  Allergen Reactions  . Bee Venom Anaphylaxis  . Latex Anaphylaxis and Rash    I have reviewed the past Medical Hx, Surgical Hx, Social Hx, Allergies and Medications.   Review of Systems  Constitutional: Negative for fever, chills and diaphoresis.  HENT: Positive for dental problem. Negative for facial swelling.   Gastrointestinal: Negative for abdominal pain.  Genitourinary: Negative for vaginal bleeding.  Skin: Negative for pallor.  Neurological: Negative for weakness.    OBJECTIVE Patient Vitals for the past 24 hrs:  BP Temp Pulse Resp SpO2 Height Weight  04/07/15 2136 124/76 mmHg 97.5 F (36.4 C) 88 20 100 %   (1.727 m) 205 lb 6.4 oz (93.169 kg)   Constitutional: Well-developed, well-nourished female in no acute distress.  Head: Left upper out gum moderately swollen w/ 2 mm opening. No active draining. No facial swelling. Mild left facial tenderness. Cardiovascular: normal rate Respiratory: normal rate and effort.  GI: Gravid appropriate for gestational age. Neurologic: Alert and oriented x 4.   FHR 153 by doppler.   LAB RESULTS NA  IMAGING NA  MAU COURSE - 30 year-old female at 38.[redacted] week gestation w/ draining dental abscess, but no evidence of spreading or systemic infection.   MDM  ASSESSMENT 1. Dental abscess   2. Current maternal conditions classifiable elsewhere, antepartum      PLAN Discharge home in stable condition. Abscess Precautions Rx Amox and Ultram  Follow-up Information    Follow up with Dentist.   Why:  As needed if symptoms worsen      Follow up with [facility] EM[facility]GENCY [facility].   Specialty:  Emergency Medicine   Why:  As needed for dental emergencies (fever greater than >100.4, severe facial swelling)   Contact information:   [address] [id number] mc [place] [address] [phone]      Follow up with [facility] [place] [facility]ERN[facility].   Why:  pregnancy emergencies   Contact information:   [address] [id number] mc [place] [address] [phone]       Medication List    TAKE these medications        acetaminophen 500 MG tablet  Commonly known as:  TYLENOL  Take 500 mg by mouth every 6 (six) hours as needed for headache.     amoxicillin 500 MG capsule  Commonly known as:  AMOXIL  Take 1 capsule (500 mg total) by mouth 3 (three) times daily.     diphenhydrAMINE 25 MG tablet  Commonly known as:  BENADRYL  Take 25-50 mg by mouth every 6 (six) hours as needed for allergies.     EPIPEN IJ  Inject as directed once.     metoCLOPramide 10 MG tablet  Commonly known as:  REGLAN  Take 1 tablet (10 mg total) by mouth 4 (four) times daily.     PRENATE PIXIE 10-0.6-0.4-200 MG Caps  Take 1 tablet by mouth daily.     promethazine 50 MG tablet  Commonly known as:  PHENERGAN  Take 1 tablet (50 mg total) by mouth every 6 (six) hours as needed for nausea or vomiting.     traMADol 50 MG tablet  Commonly known as:  ULTRAM  Take 1-2 tablets (50-100 mg total) by mouth every 6 (six) hours as needed for severe pain.         [place], [place] 04/07/2015  11:38 PM

## 2015-04-11 ENCOUNTER — Other Ambulatory Visit (HOSPITAL_COMMUNITY): Payer: Self-pay

## 2015-04-12 LAB — AFP, QUAD SCREEN
DIA Mom Value: 0.61
DIA VALUE (EIA): 95.4 pg/mL
DSR (By Age)    1 IN: 732
DSR (SECOND TRIMESTER) 1 IN: 10000
GESTATIONAL AGE AFP: 19.1 wk
MSAFP Mom: 1.15
MSAFP: 51.7 ng/mL
MSHCG Mom: 0.95
MSHCG: 19846 m[IU]/mL
Maternal Age At EDD: 29.6 YEARS
Osb Risk: 10000
PDF: 0
Test Results:: NEGATIVE
UE3 MOM: 1.1
Weight: 204 [lb_av]
uE3 Value: 1.66 ng/mL

## 2015-04-13 ENCOUNTER — Other Ambulatory Visit: Payer: Self-pay | Admitting: Certified Nurse Midwife

## 2015-04-17 ENCOUNTER — Telehealth (HOSPITAL_COMMUNITY): Payer: Self-pay | Admitting: MS"

## 2015-04-17 NOTE — Telephone Encounter (Signed)
Called Katie Riggs to discuss her prenatal cell free DNA test results.  Ms. Katie Riggs had Panorama testing through [facility].  The patient was identified by name and DOB.  We reviewed that these are within normal limits, showing a less than 1 in 10,000 risk for trisomies 21, 18 and 13, and monosomy X (Turner syndrome).  In addition, the risk for triploidy/vanishing twin and sex chromosome trisomies (47,XXX and 47,XXY) was also low risk. We reviewed that this testing identifies > 99% of pregnancies with trisomy 3921, trisomy 4113, sex chromosome trisomies (47,XXX and 47,XXY), and triploidy. The detection rate for trisomy 18 is 96%.  The detection rate for monosomy X is ~92%.  The false positive rate is <0.1% for all conditions. Testing was also consistent with female fetal sex. She understands that this testing does not identify all genetic conditions.  All questions were answered to her satisfaction, she was encouraged to call with additional questions or concerns.  Quinn PlowmanKaren Aricka Goldberger, MS Certified Genetic Counselor 04/17/2015 10:21 AM

## 2015-04-25 ENCOUNTER — Other Ambulatory Visit: Payer: Self-pay | Admitting: Certified Nurse Midwife

## 2015-05-03 ENCOUNTER — Ambulatory Visit (INDEPENDENT_AMBULATORY_CARE_PROVIDER_SITE_OTHER): Payer: BLUE CROSS/BLUE SHIELD | Admitting: Certified Nurse Midwife

## 2015-05-03 VITALS — BP 112/70 | HR 90 | Temp 98.2°F | Wt 211.0 lb

## 2015-05-03 DIAGNOSIS — Z3492 Encounter for supervision of normal pregnancy, unspecified, second trimester: Secondary | ICD-10-CM

## 2015-05-03 DIAGNOSIS — J302 Other seasonal allergic rhinitis: Secondary | ICD-10-CM

## 2015-05-03 LAB — POCT URINALYSIS DIPSTICK
Bilirubin, UA: NEGATIVE
Glucose, UA: NEGATIVE
Ketones, UA: NEGATIVE
Leukocytes, UA: NEGATIVE
NITRITE UA: NEGATIVE
PH UA: 7
Protein, UA: NEGATIVE
RBC UA: NEGATIVE
UROBILINOGEN UA: NEGATIVE

## 2015-05-03 MED ORDER — EPINEPHRINE 0.15 MG/0.3ML IJ SOAJ: 0.1500 mg | INTRAMUSCULAR | Status: AC | PRN

## 2015-05-03 MED ORDER — FLUTICASONE PROPIONATE 50 MCG/ACT NA SUSP: 2.0000 | Freq: Every day | NASAL | Status: AC

## 2015-05-03 MED ORDER — ALBUTEROL SULFATE HFA 108 (90 BASE) MCG/ACT IN AERS: 2.0000 | INHALATION_SPRAY | Freq: Four times a day (QID) | RESPIRATORY_TRACT | Status: AC | PRN

## 2015-05-03 MED ORDER — LORATADINE 10 MG PO TABS: 10.0000 mg | ORAL_TABLET | Freq: Every day | ORAL | Status: AC

## 2015-05-03 NOTE — Progress Notes (Signed)
Subjective:    Katie Riggs is a 30 y.o. female being seen today for her obstetrical visit. She is at 5114w6d gestation. Patient reports: backache, no bleeding, no contractions, no cramping, no leaking and seasonal allergies, has been taking benadryl.  needs refill on epipen and albuterol . Fetal movement: normal.  Problem List Items Addressed This Visit    None    Visit Diagnoses    Prenatal care, second trimester    -  Primary    Relevant Orders    POCT urinalysis dipstick (Completed)    Seasonal allergies        Relevant Medications    albuterol (PROVENTIL HFA;VENTOLIN HFA) 108 (90 Base) MCG/ACT inhaler    fluticasone (FLONASE) 50 MCG/ACT nasal spray    loratadine (CLARITIN) 10 MG tablet    EPINEPHrine (EPIPEN JR) 0.15 MG/0.3ML injection      Patient Active Problem List   Diagnosis Date Noted  . Thyrotoxicosis 02/17/2015  . Supervision of other normal pregnancy, antepartum 02/07/2015  . Vaginal discharge 02/19/2012   Objective:    BP 112/70 mmHg  Pulse 90  Temp(Src) 98.2 F (36.8 C)  Wt 211 lb (95.709 kg)  LMP 11/24/2014 FHT: 157 BPM  Uterine Size: size equals dates     Assessment:    Pregnancy @ 6814w6d    Seasonal allergies Plan:    OBGCT: discussed. Signs and symptoms of preterm labor: discussed.  Labs, problem list reviewed and updated 2 hr GTT planned Follow up in 4 weeks.

## 2015-05-03 NOTE — Progress Notes (Signed)
Patient having back & left hip pain. ? sciatica

## 2015-05-16 ENCOUNTER — Other Ambulatory Visit (HOSPITAL_COMMUNITY): Payer: Self-pay | Admitting: Maternal and Fetal Medicine

## 2015-05-16 ENCOUNTER — Encounter (HOSPITAL_COMMUNITY): Payer: Self-pay

## 2015-05-16 ENCOUNTER — Ambulatory Visit (HOSPITAL_COMMUNITY)
Admission: RE | Admit: 2015-05-16 | Discharge: 2015-05-16 | Disposition: A | Discharge status: Home / Self Care | Payer: BLUE CROSS/BLUE SHIELD | Source: Ambulatory Visit | Attending: Certified Nurse Midwife | Admitting: Certified Nurse Midwife

## 2015-05-16 DIAGNOSIS — O99212 Obesity complicating pregnancy, second trimester: Secondary | ICD-10-CM | POA: Diagnosis not present

## 2015-05-16 DIAGNOSIS — Z3A24 24 weeks gestation of pregnancy: Secondary | ICD-10-CM | POA: Diagnosis not present

## 2015-05-16 DIAGNOSIS — IMO0002 Reserved for concepts with insufficient information to code with codable children: Secondary | ICD-10-CM

## 2015-05-16 DIAGNOSIS — IMO0001 Reserved for inherently not codable concepts without codable children: Secondary | ICD-10-CM

## 2015-05-16 DIAGNOSIS — Z36 Encounter for antenatal screening of mother: Secondary | ICD-10-CM | POA: Diagnosis not present

## 2015-05-16 DIAGNOSIS — E079 Disorder of thyroid, unspecified: Secondary | ICD-10-CM | POA: Insufficient documentation

## 2015-05-16 DIAGNOSIS — O99282 Endocrine, nutritional and metabolic diseases complicating pregnancy, second trimester: Secondary | ICD-10-CM | POA: Insufficient documentation

## 2015-05-16 DIAGNOSIS — Z0489 Encounter for examination and observation for other specified reasons: Secondary | ICD-10-CM

## 2015-05-16 DIAGNOSIS — O358XX0 Maternal care for other (suspected) fetal abnormality and damage, not applicable or unspecified: Secondary | ICD-10-CM

## 2015-05-16 DIAGNOSIS — O4442 Low lying placenta NOS or without hemorrhage, second trimester: Secondary | ICD-10-CM | POA: Insufficient documentation

## 2015-05-18 ENCOUNTER — Ambulatory Visit (INDEPENDENT_AMBULATORY_CARE_PROVIDER_SITE_OTHER): Payer: BLUE CROSS/BLUE SHIELD | Admitting: Internal Medicine

## 2015-05-18 ENCOUNTER — Encounter: Payer: Self-pay | Admitting: Internal Medicine

## 2015-05-18 VITALS — BP 104/60 | HR 106 | Temp 98.6°F | Resp 12 | Wt 209.0 lb

## 2015-05-18 DIAGNOSIS — E0580 Other thyrotoxicosis without thyrotoxic crisis or storm: Secondary | ICD-10-CM

## 2015-05-18 LAB — T3, FREE: T3, Free: 3.5 pg/mL (ref 2.3–4.2)

## 2015-05-18 LAB — TSH: TSH: 1.11 u[IU]/mL (ref 0.35–4.50)

## 2015-05-18 LAB — T4, FREE: Free T4: 0.64 ng/dL (ref 0.60–1.60)

## 2015-05-18 NOTE — Progress Notes (Signed)
Patient ID: Katie LouisBrittany C Mcneff, female   DOB: February 24, 1985, 30 y.o.   MRN: [id number]   HPI  Katie Riggs is a 30 y.o.-year-old female, initially referred by Orvilla Cornwallachelle Denney, CNM, for evaluation and management of  gestational transient thyrotoxicosis. Last visit 3 months ago.  She is 25-week pregnant. Due Date 09/02/2015.   Patient was recently found to have a low TSH along with a slightly high total T4 during her initial pregnancy evaluation by her OB/GYN provider.  I reviewed pt's thyroid tests: Lab Results  Component Value Date   TSH 0.27* 02/17/2015   TSH 0.050* 02/07/2015   TSH 0.534 07/19/2014   FREET4 0.74 02/17/2015    Component     Latest Ref Rng 02/07/2015  T4, Total     4.5 - 12.0 ug/dL 46.913.0 (H)  T3 Uptake     22 - 35 % 20 (L)  Free Thyroxine Index     1.4 - 3.8 2.6   Component     Latest Ref Rng 02/17/2015  TSI     <140 % baseline 102   This is her second child >> 2008 >> reportedlynormal TFTs then.  Pt denies feeling nodules in neck, hoarseness, dysphagia/odynophagia, SOB with lying down; she c/o: - + weight gain with pregnancy - resolved nausea - some fatigue, not too pronounced - no excessive sweating/heat intolerance - no tremors - no anxiety - no palpitations - no hyperdefecation - no hair loss  Pt does not have a FH of thyroid ds. No FH of thyroid cancer. No h/o radiation tx to head or neck.  No seaweed or kelp, no recent contrast studies. No steroid use. No herbal supplements. No Biotin use.  ROS: Constitutional: + weight gain, slight fatigue, no subjective hyperthermia/hypothermia, no nocturia, no poor sleep Eyes: no blurry vision, no xerophthalmia ENT: no sore throat, no nodules palpated in throat, no dysphagia/odynophagia, no hoarseness Cardiovascular: no CP/palpitations/leg swelling Respiratory: no cough/SOB Gastrointestinal: no N/V/D/C Musculoskeletal: no muscle/joint aches Skin: no rashes Neurological: no  tremors/numbness/tingling/dizziness  I reviewed pt's medications, allergies, PMH, social hx, family hx, and changes were documented in the history of present illness. Otherwise, unchanged from my initial visit note.  Past Medical History  Diagnosis Date  . Asthma   . IUD (intrauterine device) in place     Mirena.  States it's been out since June 2015   Past Surgical History  Procedure Laterality Date  . Wisdom tooth extraction     Social History   Social History  . Marital Status: Single    Spouse Name: N/A  . Number of Children: 1   Occupational History  . Customer care specialist   Social History Main Topics  . Smoking status: Never Smoker   . Smokeless tobacco: Never Used  . Alcohol Use: No     Comment: ocassionally beer or wine  . Drug Use: No  . Sexual Activity:    Partners: Male    Birth Control/ Protection: None   Current Outpatient Prescriptions on File Prior to Visit  Medication Sig Dispense Refill  . acetaminophen (TYLENOL) 500 MG tablet Take 500 mg by mouth every 6 (six) hours as needed for headache.     . albuterol (PROVENTIL HFA;VENTOLIN HFA) 108 (90 Base) MCG/ACT inhaler Inhale 2 puffs into the lungs every 6 (six) hours as needed for wheezing or shortness of breath. 1 Inhaler 2  . diphenhydrAMINE (BENADRYL) 25 MG tablet Take 25-50 mg by mouth every 6 (six) hours as needed for allergies.    .Marland Kitchen  EPINEPHrine (EPIPEN JR) 0.15 MG/0.3ML injection Inject 0.3 mLs (0.15 mg total) into the muscle as needed for anaphylaxis. 2 each PRN  . fluticasone (FLONASE) 50 MCG/ACT nasal spray Place 2 sprays into both nostrils daily. 16 g 2  . loratadine (CLARITIN) 10 MG tablet Take 1 tablet (10 mg total) by mouth daily. 30 tablet 12  . metoCLOPramide (REGLAN) 10 MG tablet Take 1 tablet (10 mg total) by mouth 4 (four) times daily. 120 tablet 4  . Prenat-FeAsp-Meth-FA-DHA w/o A (PRENATE PIXIE) 10-0.6-0.4-200 MG CAPS Take 1 tablet by mouth daily. 30 capsule 12  . promethazine  (PHENERGAN) 50 MG tablet Take 1 tablet (50 mg total) by mouth every 6 (six) hours as needed for nausea or vomiting. 30 tablet 0  . traMADol (ULTRAM) 50 MG tablet Take 1-2 tablets (50-100 mg total) by mouth every 6 (six) hours as needed for severe pain. 30 tablet 0   No current facility-administered medications on file prior to visit.   Allergies  Allergen Reactions  . Bee Venom Anaphylaxis  . Latex Anaphylaxis and Rash   Family History  Problem Relation Age of Onset  . Stroke Mother   . Hypertension Father   . Diabetes Father    PE: BP 104/60 mmHg  Pulse 106  Temp(Src) 98.6 F (37 C) (Oral)  Resp 12  Wt 209 lb (94.802 kg)  SpO2 97%  LMP 11/24/2014 Wt Readings from Last 3 Encounters:  05/16/15 210 lb 8 oz (95.482 kg)  05/03/15 211 lb (95.709 kg)  04/07/15 205 lb 6.4 oz (93.169 kg)   Constitutional: overweight, in NAD Eyes: PERRLA, EOMI, no exophthalmos, no lid lag, no stare ENT: moist mucous membranes, + symmetric thyromegaly, no cervical lymphadenopathy Cardiovascular: tachycardia, RR, No MRG Respiratory: CTA B Gastrointestinal: abdomen soft, NT, ND, BS+ Musculoskeletal: no deformities, strength intact in all 4 Skin: moist, warm, no rashes Neurological: no tremor with outstretched hands, DTR normal in all 4  ASSESSMENT: 1. Gestational transient thyrotoxicosis (GTT)   PLAN:  1. Patient with low TSH readings at the beginning of current pregnancy, improving. No thyrotoxic sxs: denies weight loss, heat intolerance, hyperdefecation, palpitations, anxiety. However, she continues to have a high pulse, which could be due to pregnancy itself. We discussed at last visit that there are several possible causes for thyrotoxicosis, however, the clinical picture along with the lab values pointed towards GTT (gestational transient thyrotoxicosis) which is caused by the increased beta hCG  whichcan stimulate the thyroid and produce a thyrotoxic picture. The condition is usually  self-limiting with the progression of the pregnancy and does not cause problems in either mother or baby, unless very severe. Indeed, a repeat set of thyroid tests were much improved at last visit. Her Graves antibodies were not elevated, confirming GTT. - Will repeat the TSH, fT3 and fT4  today - RTC as needed for a visit, but will need another set of TFTs 1 mo after she gives birth even if TFTs today re normal  Office Visit on 05/18/2015  Component Date Value Ref Range Status  . T3, Free 05/18/2015 3.5  2.3 - 4.2 pg/mL Final  . Free T4 05/18/2015 0.64  0.60 - 1.60 ng/dL Final  . TSH 16/10/9602 1.11  0.35 - 4.50 uIU/mL Final   TFTs are normal.

## 2015-05-18 NOTE — Patient Instructions (Signed)
Please stop at the lab.  We will definitely need another set of labs in 09/2015.  Please schedule a new appt as needed in the future.

## 2015-05-23 ENCOUNTER — Other Ambulatory Visit: Payer: Self-pay | Admitting: Certified Nurse Midwife

## 2015-05-31 ENCOUNTER — Ambulatory Visit (INDEPENDENT_AMBULATORY_CARE_PROVIDER_SITE_OTHER): Payer: BLUE CROSS/BLUE SHIELD | Admitting: Certified Nurse Midwife

## 2015-05-31 VITALS — BP 111/69 | HR 97 | Temp 98.9°F | Wt 212.0 lb

## 2015-05-31 DIAGNOSIS — Z3493 Encounter for supervision of normal pregnancy, unspecified, third trimester: Secondary | ICD-10-CM

## 2015-05-31 LAB — POCT URINALYSIS DIPSTICK
Bilirubin, UA: NEGATIVE
Blood, UA: NEGATIVE
Glucose, UA: NEGATIVE
Ketones, UA: NEGATIVE
Leukocytes, UA: NEGATIVE
Nitrite, UA: NEGATIVE
Protein, UA: NEGATIVE
Spec Grav, UA: 1.02
Urobilinogen, UA: NEGATIVE
pH, UA: 6

## 2015-05-31 NOTE — Progress Notes (Signed)
Lump under right underarm - Noticed x 2 weeks

## 2015-05-31 NOTE — Progress Notes (Signed)
Subjective:    Robbie LouisBrittany C Wehrly is a 30 y.o. female being seen today for her obstetrical visit. She is at 2846w6d gestation. Patient reports: no complaints . Fetal movement: normal.  Problem List Items Addressed This Visit    None    Visit Diagnoses    Prenatal care in third trimester    -  Primary    Relevant Orders    POCT Urinalysis Dipstick (Completed)      Patient Active Problem List   Diagnosis Date Noted  . Gestational transient thyrotoxicosis 02/17/2015  . Supervision of other normal pregnancy, antepartum 02/07/2015  . Vaginal discharge 02/19/2012   Objective:    BP 111/69 mmHg  Pulse 97  Temp(Src) 98.9 F (37.2 C)  Wt 212 lb (96.163 kg)  LMP 11/24/2014 FHT: 145 BPM  Uterine Size: 28 cm and size greater than dates     Assessment:    Pregnancy @ 3346w6d    lumbar/sciatic back pain  Plan:   mother to be abdominal support belt  OBGCT: discussed and ordered for next visit. Signs and symptoms of preterm labor: discussed.  Labs, problem list reviewed and updated 2 hr GTT planned Follow up in 2 weeks.

## 2015-06-13 ENCOUNTER — Ambulatory Visit (INDEPENDENT_AMBULATORY_CARE_PROVIDER_SITE_OTHER): Payer: BLUE CROSS/BLUE SHIELD | Admitting: Certified Nurse Midwife

## 2015-06-13 ENCOUNTER — Other Ambulatory Visit: Payer: BLUE CROSS/BLUE SHIELD

## 2015-06-13 VITALS — BP 109/66 | HR 83 | Wt 216.0 lb

## 2015-06-13 DIAGNOSIS — Z3493 Encounter for supervision of normal pregnancy, unspecified, third trimester: Secondary | ICD-10-CM

## 2015-06-13 LAB — POCT URINALYSIS DIPSTICK
Bilirubin, UA: NEGATIVE
Blood, UA: NEGATIVE
GLUCOSE UA: NEGATIVE
Ketones, UA: NEGATIVE
LEUKOCYTES UA: NEGATIVE
NITRITE UA: NEGATIVE
Protein, UA: NEGATIVE
Spec Grav, UA: 1.01
UROBILINOGEN UA: NEGATIVE
pH, UA: 6

## 2015-06-13 NOTE — Progress Notes (Signed)
Subjective:    Katie Riggs is a 30 y.o. female being seen today for her obstetrical visit. She is at 2249w5d gestation. Patient reports no complaints. Fetal movement: normal.  Problem List Items Addressed This Visit    None    Visit Diagnoses    Prenatal care in third trimester    -  Primary    Relevant Orders    POCT Urinalysis Dipstick (Completed)    Glucose Tolerance, 2 Hours w/1 Hour    CBC    HIV antibody    RPR      Patient Active Problem List   Diagnosis Date Noted  . Gestational transient thyrotoxicosis 02/17/2015  . Supervision of other normal pregnancy, antepartum 02/07/2015  . Vaginal discharge 02/19/2012   Objective:    BP 109/66 mmHg  Pulse 83  Wt 216 lb (97.977 kg)  LMP 11/24/2014 FHT:  150 BPM  Uterine Size: size equals dates  Presentation: unsure     Assessment:    Pregnancy @ 3749w5d weeks   Plan:    2 hour OGTT today   labs reviewed, problem list updated Consent signed. GBS planning TDAP offered  Rhogam given for RH negative Pediatrician: discussed. Infant feeding: plans to breastfeed. Maternity leave: discussed. Cigarette smoking: never smoked. Orders Placed This Encounter  Procedures  . Glucose Tolerance, 2 Hours w/1 Hour  . CBC  . HIV antibody  . RPR  . POCT Urinalysis Dipstick   No orders of the defined types were placed in this encounter.   Follow up in 2 Weeks.

## 2015-06-14 LAB — GLUCOSE TOLERANCE, 2 HOURS W/ 1HR
Glucose, 1 hour: 126 mg/dL (ref 65–179)
Glucose, 2 hour: 89 mg/dL (ref 65–152)
Glucose, Fasting: 74 mg/dL (ref 65–91)

## 2015-06-14 LAB — CBC
HEMATOCRIT: 34.8 % (ref 34.0–46.6)
HEMOGLOBIN: 11.7 g/dL (ref 11.1–15.9)
MCH: 28.7 pg (ref 26.6–33.0)
MCHC: 33.6 g/dL (ref 31.5–35.7)
MCV: 85 fL (ref 79–97)
Platelets: 289 10*3/uL (ref 150–379)
RBC: 4.08 x10E6/uL (ref 3.77–5.28)
RDW: 14.1 % (ref 12.3–15.4)
WBC: 6.9 10*3/uL (ref 3.4–10.8)

## 2015-06-14 LAB — HIV ANTIBODY (ROUTINE TESTING W REFLEX): HIV Screen 4th Generation wRfx: NONREACTIVE

## 2015-06-14 LAB — RPR: RPR: NONREACTIVE

## 2015-06-27 ENCOUNTER — Ambulatory Visit (INDEPENDENT_AMBULATORY_CARE_PROVIDER_SITE_OTHER): Payer: BLUE CROSS/BLUE SHIELD | Admitting: Certified Nurse Midwife

## 2015-06-27 VITALS — BP 115/68 | HR 91 | Temp 98.8°F | Wt 217.0 lb

## 2015-06-27 DIAGNOSIS — Z3493 Encounter for supervision of normal pregnancy, unspecified, third trimester: Secondary | ICD-10-CM

## 2015-06-27 LAB — POCT URINALYSIS DIPSTICK
Bilirubin, UA: NEGATIVE
Blood, UA: NEGATIVE
GLUCOSE UA: NEGATIVE
KETONES UA: NEGATIVE
Leukocytes, UA: NEGATIVE
Nitrite, UA: NEGATIVE
Protein, UA: NEGATIVE
SPEC GRAV UA: 1.015
Urobilinogen, UA: NEGATIVE
pH, UA: 6

## 2015-06-27 NOTE — Progress Notes (Signed)
Subjective:    Katie Riggs is a 30 y.o. female being seen today for her obstetrical visit. She is at 3524w5d gestation. Patient reports no complaints. Fetal movement: normal.  Problem List Items Addressed This Visit    None    Visit Diagnoses    Prenatal care, third trimester    -  Primary    Relevant Orders    POCT urinalysis dipstick (Completed)      Patient Active Problem List   Diagnosis Date Noted  . Gestational transient thyrotoxicosis 02/17/2015  . Supervision of other normal pregnancy, antepartum 02/07/2015  . Vaginal discharge 02/19/2012   Objective:    BP 115/68 mmHg  Pulse 91  Temp(Src) 98.8 F (37.1 C)  Wt 217 lb (98.431 kg)  LMP 11/24/2014 FHT:  158 BPM  Uterine Size: size equals dates  Presentation: unsure     Assessment:    Pregnancy @ 7324w5d weeks   Plan:    Desires waterbirth: information given   labs reviewed, problem list updated Consent signed. GBS planning TDAP offered  Rhogam given for RH negative Pediatrician: discussed. Infant feeding: plans to breastfeed. Maternity leave: discussed. Cigarette smoking: never smoked. Orders Placed This Encounter  Procedures  . POCT urinalysis dipstick   No orders of the defined types were placed in this encounter.   Follow up in 2 Weeks.

## 2015-07-12 ENCOUNTER — Ambulatory Visit (INDEPENDENT_AMBULATORY_CARE_PROVIDER_SITE_OTHER): Payer: BLUE CROSS/BLUE SHIELD | Admitting: Certified Nurse Midwife

## 2015-07-12 VITALS — BP 109/67 | HR 94 | Wt 215.0 lb

## 2015-07-12 DIAGNOSIS — Z3493 Encounter for supervision of normal pregnancy, unspecified, third trimester: Secondary | ICD-10-CM

## 2015-07-12 LAB — POCT URINALYSIS DIPSTICK
Bilirubin, UA: NEGATIVE
Blood, UA: NEGATIVE
GLUCOSE UA: NEGATIVE
Ketones, UA: NEGATIVE
LEUKOCYTES UA: NEGATIVE
NITRITE UA: NEGATIVE
Protein, UA: NEGATIVE
Spec Grav, UA: 1.01
UROBILINOGEN UA: NEGATIVE
pH, UA: 7

## 2015-07-12 NOTE — Progress Notes (Signed)
Subjective:    Katie Riggs is a 30 y.o. female being seen today for her obstetrical visit. She is at 8549w6d gestation. Patient reports no complaints. Fetal movement: normal.  Problem List Items Addressed This Visit    None    Visit Diagnoses    Prenatal care, third trimester    -  Primary    Relevant Orders    POCT urinalysis dipstick (Completed)      Patient Active Problem List   Diagnosis Date Noted  . Gestational transient thyrotoxicosis 02/17/2015  . Supervision of other normal pregnancy, antepartum 02/07/2015  . Vaginal discharge 02/19/2012   Objective:    BP 109/67 mmHg  Pulse 94  Wt 215 lb (97.523 kg)  LMP 11/24/2014 FHT:  155 BPM  Uterine Size: size equals dates  Presentation: cephalic     Assessment:    Pregnancy @ 6549w6d weeks   Doing well Plan:    Attend waterbirth class this evening   labs reviewed, problem list updated Consent signed. GBS planning TDAP offered  Rhogam given for RH negative Pediatrician: discussed. Infant feeding: plans to breastfeed. Maternity leave: discussed. Cigarette smoking: never smoked. Orders Placed This Encounter  Procedures  . POCT urinalysis dipstick   No orders of the defined types were placed in this encounter.   Follow up in 2 Weeks.

## 2015-07-28 ENCOUNTER — Ambulatory Visit (INDEPENDENT_AMBULATORY_CARE_PROVIDER_SITE_OTHER): Payer: BLUE CROSS/BLUE SHIELD | Admitting: Certified Nurse Midwife

## 2015-07-28 VITALS — BP 122/74 | HR 92 | Temp 98.3°F | Wt 220.8 lb

## 2015-07-28 DIAGNOSIS — Z3483 Encounter for supervision of other normal pregnancy, third trimester: Secondary | ICD-10-CM

## 2015-07-28 LAB — POCT URINALYSIS DIPSTICK
BILIRUBIN UA: NEGATIVE
GLUCOSE UA: NEGATIVE
Leukocytes, UA: NEGATIVE
NITRITE UA: NEGATIVE
PH UA: 5
PROTEIN UA: NEGATIVE
RBC UA: NEGATIVE
Spec Grav, UA: 1.02
Urobilinogen, UA: NEGATIVE

## 2015-07-28 NOTE — Patient Instructions (Signed)
Thinking About Doren Custard???  You must attend a Doren Custard class at [facility]  3rd Wednesday of every month from 7-9pm  Free  [employer] by calling [phone] or online at [email or web address]  Bring [place] the certificate from the class  Waterbirth supplies needed for [facility]/[place]/Health Department patients:  Our practice has a Heritage manager in a Box tub at the hospital that you can borrow  You will need to purchase an accessory kit that has all needed supplies through [facility] [phone]) or online $175.00  Or you can purchase the supplies separately: o Single-use disposable tub liner for Birth Pool in a Box (REGULAR size) o New garden hose labeled "lead-free", "suitable for drinking water", o Electric drain pump to remove water (We recommend 792 gallon per hour or greater pump.)  o  "non-toxic" OR "water potable" o Garden hose to remove the dirty water o Fish net o Bathing suit top (optional) o Long-handled mirror (optional)  [employer] sells tubs for ~ $120 if you would rather purchase your own tub.  They also sell accessories, liners.    Www.waterbirthsolutions.com for tub purchases and supplies  The Labor Ladies (www.thelaborladies.com) $275 for tub rental/set-up & take down/kit   [employer] information regarding doulas (labor support) who provide pool rentals:  IdentityList.se.htm   The Labor Ladies (www.thelaborladies.com)  IdentityList.se.htm   Things that would prevent you from having a waterbirth:  Premature, <37wks  Previous cesarean birth  Presence of thick meconium-stained fluid  Multiple gestation (Twins, triplets, etc.)  Uncontrolled diabetes or gestational diabetes requiring medication  Hypertension  Heavy vaginal bleeding  Non-reassuring fetal heart rate  Active infection (MRSA, etc.)  If your labor has to be induced and induction  method requires continuous monitoring of the baby's heart rate  Other risks/issues identified by your obstetrical provider

## 2015-07-28 NOTE — Progress Notes (Signed)
Subjective:    Katie Riggs is a 30 y.o. female being seen today for her obstetrical visit. She is at 1525w1d gestation. Patient reports no complaints. Fetal movement: normal.  Problem List Items Addressed This Visit      Other   Supervision of other normal pregnancy, antepartum     Clinic  [facility] Prenatal Labs  Dating  LMP Blood type: A/POS/-- (01/17 1019)   Genetic Screen 1 Screen:    AFP: 04/04/15: normal    Quad:     NIPS: Antibody:NEG (01/17 1019)  Anatomic US  Rubella: 11.50 (01/17 1019)  GTT Early:               Third trimester: Neg.  RPR: Non Reactive (05/23 1043)   Flu vaccine  HBsAg: NEGATIVE (01/17 1019)   TDaP vaccine                                               Rhogam: N/A HIV: Non Reactive (05/23 1043)   Baby Food      Breast                                          GBS: (For PCN allergy, check sensitivities)  Contraception  Mirena IUD Pap:  Circumcision  N/A   Pediatrician    Support Person           Other Visit Diagnoses    Encounter for supervision of other normal pregnancy in third trimester    -  Primary    Relevant Orders    POCT urinalysis dipstick (Completed)    Strep Gp B NAA    NuSwab VG+, Candida 6sp      Patient Active Problem List   Diagnosis Date Noted  . Gestational transient thyrotoxicosis 02/17/2015  . Supervision of other normal pregnancy, antepartum 02/07/2015  . Vaginal discharge 02/19/2012   Objective:    BP 122/74 mmHg  Pulse 92  Temp(Src) 98.3 F (36.8 C)  Wt 220 lb 12.8 oz (100.154 kg)  LMP 11/24/2014 FHT:  130 BPM  Uterine Size: size equals dates  Presentation: cephalic     Assessment:    Pregnancy @ 3725w1d weeks   Plan:     labs reviewed, problem list updated Consent signed. GBS sent TDAP offered  Rhogam given for RH negative Pediatrician: discussed. Infant feeding: plans to breastfeed. Maternity leave: discussed. Cigarette smoking: never smoked. Orders Placed This Encounter  Procedures  . Strep Gp B NAA   . POCT urinalysis dipstick   No orders of the defined types were placed in this encounter.   Follow up in 1 Week.

## 2015-07-28 NOTE — Assessment & Plan Note (Signed)
  Clinic  [facility] Prenatal Labs  Dating  LMP Blood type: A/POS/-- (01/17 1019)   Genetic Screen 1 Screen:    AFP: 04/04/15: normal    Quad:     NIPS: Antibody:NEG (01/17 1019)  Anatomic US  Rubella: 11.50 (01/17 1019)  GTT Early:               Third trimester: Neg.  RPR: Non Reactive (05/23 1043)   Flu vaccine  HBsAg: NEGATIVE (01/17 1019)   TDaP vaccine                                               Rhogam: N/A HIV: Non Reactive (05/23 1043)   Baby Food      Breast                                          GBS: (For PCN allergy, check sensitivities)  Contraception  Mirena IUD Pap:  Circumcision  N/A   Pediatrician    Support Person

## 2015-07-30 LAB — STREP GP B NAA: STREP GROUP B AG: POSITIVE — AB

## 2015-08-01 ENCOUNTER — Other Ambulatory Visit: Payer: Self-pay | Admitting: Certified Nurse Midwife

## 2015-08-02 ENCOUNTER — Telehealth: Payer: Self-pay | Admitting: *Deleted

## 2015-08-03 NOTE — Telephone Encounter (Signed)
Patient made aware of GBS status and plan of care during labor of IV Abx for Tx.

## 2015-08-04 ENCOUNTER — Ambulatory Visit (INDEPENDENT_AMBULATORY_CARE_PROVIDER_SITE_OTHER): Payer: BLUE CROSS/BLUE SHIELD | Admitting: Certified Nurse Midwife

## 2015-08-04 DIAGNOSIS — Z3483 Encounter for supervision of other normal pregnancy, third trimester: Secondary | ICD-10-CM

## 2015-08-04 DIAGNOSIS — Z3492 Encounter for supervision of normal pregnancy, unspecified, second trimester: Secondary | ICD-10-CM

## 2015-08-04 LAB — POCT URINALYSIS DIPSTICK
Bilirubin, UA: NEGATIVE
GLUCOSE UA: NORMAL
Ketones, UA: NEGATIVE
Leukocytes, UA: NEGATIVE
NITRITE UA: NEGATIVE
PROTEIN UA: NEGATIVE
RBC UA: NEGATIVE
Spec Grav, UA: 1.015
UROBILINOGEN UA: NEGATIVE
pH, UA: 6

## 2015-08-04 NOTE — Addendum Note (Signed)
Addended by: Samantha CrimesENNEY, Cai Anfinson ANNE on: 08/04/2015 04:05 PM   Modules accepted: Orders

## 2015-08-04 NOTE — Assessment & Plan Note (Signed)
  Clinic  Prenatal Labs  Dating  Blood type: A/POS/-- (01/17 1019)   Genetic Screen 1 Screen:    AFP:     Quad:     NIPS: Antibody:NEG (01/17 1019)  Anatomic US  Rubella: 11.50 (01/17 1019)  GTT Early:               Third trimester:  RPR: Non Reactive (05/23 1043)   Flu vaccine  HBsAg: NEGATIVE (01/17 1019)   TDaP vaccine                                               Rhogam: N/A HIV: Non Reactive (05/23 1043)   Baby Food                         Breast                      WUJ:[id number]:Positive (07/07 1645)(For PCN allergy, check sensitivities)  Contraception  Mirena IUD Pap:  Circumcision  N/A   Pediatrician  [facility] [place]   Support Person

## 2015-08-04 NOTE — Progress Notes (Signed)
Subjective:    Katie Riggs is a 30 y.o. female being seen today for her obstetrical visit. She is at 5285w1d gestation. Patient reports no complaints. Fetal movement: normal.  Waterbirth certificate of attendance received.  Has birthpool manager lined up.    Problem List Items Addressed This Visit    None    Visit Diagnoses    Supervision of normal pregnancy, second trimester    -  Primary    Relevant Orders    POCT urinalysis dipstick (Completed)      Patient Active Problem List   Diagnosis Date Noted  . Gestational transient thyrotoxicosis 02/17/2015  . Supervision of other normal pregnancy, antepartum 02/07/2015  . Vaginal discharge 02/19/2012   Objective:    LMP 11/24/2014 FHT:  153 BPM  Uterine Size: size equals dates  Presentation: cephalic     Assessment:    Pregnancy @ 1385w1d weeks    Doing well  Plan:    Waterbirth planned.     labs reviewed, problem list updated Consent signed. GBS results reviewed TDAP offered  Rhogam given for RH negative Pediatrician: discussed. Infant feeding: plans to breastfeed. Maternity leave: discussed. Cigarette smoking: never smoked. Orders Placed This Encounter  Procedures  . POCT urinalysis dipstick    Associate with diagnosis code Z13.89   No orders of the defined types were placed in this encounter.   Follow up in 1 Week.

## 2015-08-04 NOTE — Patient Instructions (Signed)
Thinking About Waterbirth???  You must attend a Waterbirth class at [facility]  3rd Wednesday of every month from 7-9pm  Free  Register by calling [phone] or online at [email or web address]  Bring us the certificate from the class  Waterbirth supplies needed for [facility]/[place]/Health Department patients:  Our practice has a Birth Pool in a Box tub at the hospital that you can borrow  You will need to purchase an accessory kit that has all needed supplies through [facility] ([phone]) or online $175.00  Or you can purchase the supplies separately: o Single-use disposable tub liner for Birth Pool in a Box (REGULAR size) o New garden hose labeled "lead-free", "suitable for drinking water", o Electric drain pump to remove water (We recommend 792 gallon per hour or greater pump.)  o  "non-toxic" OR "water potable" o Garden hose to remove the dirty water o Fish net o Bathing suit top (optional) o Long-handled mirror (optional)  Yourwater[employer]com sells tubs for ~ $120 if you would rather purchase your own tub.  They also sell accessories, liners.    Www.waterbirthsolutions.com for tub purchases and supplies  [employer] Ladies (www.thelaborladies.com) $275 for tub rental/set-up & take down/kit   [employer] information regarding doulas (labor support) who provide pool rentals:  Http://www.padanc.org/MeetUs.htm   The Labor Ladies (www.thelaborladies.com)  Http://www.padanc.org/MeetUs.htm   Things that would prevent you from having a waterbirth:  Premature, <37wks  Previous cesarean birth  Presence of thick meconium-stained fluid  Multiple gestation (Twins, triplets, etc.)  Uncontrolled diabetes or gestational diabetes requiring medication  Hypertension  Heavy vaginal bleeding  Non-reassuring fetal heart rate  Active infection (MRSA, etc.)  If your labor has to be induced and induction  method requires continuous monitoring of the baby's heart rate  Other risks/issues identified by your obstetrical provider  

## 2015-08-05 LAB — NUSWAB VG+, CANDIDA 6SP
CANDIDA ALBICANS, NAA: POSITIVE — AB
CANDIDA GLABRATA, NAA: NEGATIVE
CANDIDA PARAPSILOSIS, NAA: NEGATIVE
CANDIDA TROPICALIS, NAA: NEGATIVE
Candida krusei, NAA: NEGATIVE
Candida lusitaniae, NAA: NEGATIVE
Chlamydia trachomatis, NAA: NEGATIVE
Neisseria gonorrhoeae, NAA: NEGATIVE
Trich vag by NAA: NEGATIVE

## 2015-08-08 ENCOUNTER — Other Ambulatory Visit: Payer: Self-pay | Admitting: Certified Nurse Midwife

## 2015-08-08 ENCOUNTER — Encounter: Payer: Self-pay | Admitting: Certified Nurse Midwife

## 2015-08-08 DIAGNOSIS — B373 Candidiasis of vulva and vagina: Secondary | ICD-10-CM

## 2015-08-08 DIAGNOSIS — B3731 Acute candidiasis of vulva and vagina: Secondary | ICD-10-CM

## 2015-08-08 MED ORDER — FLUCONAZOLE 100 MG PO TABS: 100.0000 mg | ORAL_TABLET | Freq: Once | ORAL | Status: DC

## 2015-08-09 ENCOUNTER — Other Ambulatory Visit: Payer: Self-pay | Admitting: Certified Nurse Midwife

## 2015-08-09 ENCOUNTER — Ambulatory Visit (INDEPENDENT_AMBULATORY_CARE_PROVIDER_SITE_OTHER): Payer: BLUE CROSS/BLUE SHIELD | Admitting: Certified Nurse Midwife

## 2015-08-09 VITALS — BP 122/78 | HR 101 | Temp 98.8°F | Wt 222.2 lb

## 2015-08-09 DIAGNOSIS — Z3493 Encounter for supervision of normal pregnancy, unspecified, third trimester: Secondary | ICD-10-CM | POA: Diagnosis not present

## 2015-08-09 DIAGNOSIS — O26893 Other specified pregnancy related conditions, third trimester: Secondary | ICD-10-CM

## 2015-08-09 DIAGNOSIS — R12 Heartburn: Principal | ICD-10-CM

## 2015-08-09 LAB — POCT URINALYSIS DIPSTICK
BILIRUBIN UA: NEGATIVE
Glucose, UA: NEGATIVE
KETONES UA: NEGATIVE
LEUKOCYTES UA: NEGATIVE
NITRITE UA: NEGATIVE
PH UA: 5.5
PROTEIN UA: NEGATIVE
RBC UA: NEGATIVE
Spec Grav, UA: 1.015
Urobilinogen, UA: NEGATIVE

## 2015-08-09 MED ORDER — OMEPRAZOLE 20 MG PO CPDR: 20.0000 mg | DELAYED_RELEASE_CAPSULE | Freq: Two times a day (BID) | ORAL | Status: DC

## 2015-08-09 NOTE — Progress Notes (Signed)
Patient reports some irregular contraction and wants to be checked today.

## 2015-08-09 NOTE — Progress Notes (Signed)
Subjective:    Katie Riggs is a 30 y.o. female being seen today for her obstetrical visit. She is at 3114w6d gestation. Patient reports no complaints. Fetal movement: normal.  Problem List Items Addressed This Visit    None    Visit Diagnoses    Prenatal care, third trimester    -  Primary    Relevant Orders    POCT urinalysis dipstick (Completed)      Patient Active Problem List   Diagnosis Date Noted  . Gestational transient thyrotoxicosis 02/17/2015  . Supervision of other normal pregnancy, antepartum 02/07/2015  . Vaginal discharge 02/19/2012   Objective:    BP 122/78 mmHg  Pulse 101  Temp(Src) 98.8 F (37.1 C)  Wt 222 lb 3.2 oz (100.789 kg)  LMP 11/24/2014 FHT:  156 BPM  Uterine Size: size equals dates  Presentation: cephalic   Cervix: 1cm, posterior, 50% effaced, soft, ballotable.    Assessment:    Pregnancy @ 4914w6d weeks   Waterbirth planned  Plan:     labs reviewed, problem list updated Consent signed. GBS results reviewed TDAP offered  Rhogam given for RH negative Pediatrician: discussed. Infant feeding: plans to breastfeed. Maternity leave: discussed. Cigarette smoking: never smoked. Orders Placed This Encounter  Procedures  . POCT urinalysis dipstick   No orders of the defined types were placed in this encounter.   Follow up in 1 Week.

## 2015-08-10 ENCOUNTER — Telehealth: Payer: Self-pay | Admitting: *Deleted

## 2015-08-10 NOTE — Telephone Encounter (Signed)
See phone note for this encounter. 

## 2015-08-12 ENCOUNTER — Inpatient Hospital Stay (HOSPITAL_COMMUNITY)
Admission: AD | Admit: 2015-08-12 | Discharge: 2015-08-15 | DRG: 775 | Disposition: A | Discharge status: Home / Self Care | Payer: BLUE CROSS/BLUE SHIELD | Source: Ambulatory Visit | Attending: Family Medicine | Admitting: Family Medicine

## 2015-08-12 DIAGNOSIS — O4202 Full-term premature rupture of membranes, onset of labor within 24 hours of rupture: Principal | ICD-10-CM | POA: Diagnosis present

## 2015-08-12 DIAGNOSIS — Z8249 Family history of ischemic heart disease and other diseases of the circulatory system: Secondary | ICD-10-CM

## 2015-08-12 DIAGNOSIS — O4290 Premature rupture of membranes, unspecified as to length of time between rupture and onset of labor, unspecified weeks of gestation: Secondary | ICD-10-CM | POA: Diagnosis present

## 2015-08-12 DIAGNOSIS — Z3A37 37 weeks gestation of pregnancy: Secondary | ICD-10-CM

## 2015-08-12 DIAGNOSIS — O9952 Diseases of the respiratory system complicating childbirth: Secondary | ICD-10-CM | POA: Diagnosis present

## 2015-08-12 DIAGNOSIS — Z833 Family history of diabetes mellitus: Secondary | ICD-10-CM

## 2015-08-12 DIAGNOSIS — Z823 Family history of stroke: Secondary | ICD-10-CM

## 2015-08-12 DIAGNOSIS — O99824 Streptococcus B carrier state complicating childbirth: Secondary | ICD-10-CM | POA: Diagnosis present

## 2015-08-12 DIAGNOSIS — J45909 Unspecified asthma, uncomplicated: Secondary | ICD-10-CM | POA: Diagnosis present

## 2015-08-12 NOTE — MAU Note (Signed)
Pt presents complaining of SROM at 2300. Denies bleeding. Reports good fetal movement.

## 2015-08-13 ENCOUNTER — Encounter (HOSPITAL_COMMUNITY): Payer: Self-pay

## 2015-08-13 DIAGNOSIS — O99824 Streptococcus B carrier state complicating childbirth: Secondary | ICD-10-CM | POA: Diagnosis present

## 2015-08-13 DIAGNOSIS — Z833 Family history of diabetes mellitus: Secondary | ICD-10-CM | POA: Diagnosis not present

## 2015-08-13 DIAGNOSIS — Z823 Family history of stroke: Secondary | ICD-10-CM | POA: Diagnosis not present

## 2015-08-13 DIAGNOSIS — Z3A37 37 weeks gestation of pregnancy: Secondary | ICD-10-CM | POA: Diagnosis not present

## 2015-08-13 DIAGNOSIS — Z8249 Family history of ischemic heart disease and other diseases of the circulatory system: Secondary | ICD-10-CM | POA: Diagnosis not present

## 2015-08-13 DIAGNOSIS — O4290 Premature rupture of membranes, unspecified as to length of time between rupture and onset of labor, unspecified weeks of gestation: Secondary | ICD-10-CM | POA: Diagnosis present

## 2015-08-13 DIAGNOSIS — O9952 Diseases of the respiratory system complicating childbirth: Secondary | ICD-10-CM | POA: Diagnosis present

## 2015-08-13 DIAGNOSIS — O4202 Full-term premature rupture of membranes, onset of labor within 24 hours of rupture: Secondary | ICD-10-CM | POA: Diagnosis present

## 2015-08-13 DIAGNOSIS — J45909 Unspecified asthma, uncomplicated: Secondary | ICD-10-CM | POA: Diagnosis present

## 2015-08-13 LAB — TYPE AND SCREEN
ABO/RH(D): A POS
Antibody Screen: NEGATIVE

## 2015-08-13 LAB — CBC
HEMATOCRIT: 32.1 % — AB (ref 36.0–46.0)
HEMOGLOBIN: 11 g/dL — AB (ref 12.0–15.0)
MCH: 28 pg (ref 26.0–34.0)
MCHC: 34.3 g/dL (ref 30.0–36.0)
MCV: 81.7 fL (ref 78.0–100.0)
Platelets: 269 10*3/uL (ref 150–400)
RBC: 3.93 MIL/uL (ref 3.87–5.11)
RDW: 13.4 % (ref 11.5–15.5)
WBC: 8 10*3/uL (ref 4.0–10.5)

## 2015-08-13 LAB — POCT FERN TEST: POCT Fern Test: POSITIVE

## 2015-08-13 LAB — RPR: RPR Ser Ql: NONREACTIVE

## 2015-08-13 LAB — ABO/RH: ABO/RH(D): A POS

## 2015-08-13 MED ORDER — OXYCODONE-ACETAMINOPHEN 5-325 MG PO TABS: 2.0000 | ORAL_TABLET | ORAL | Status: DC | PRN

## 2015-08-13 MED ORDER — OXYTOCIN BOLUS FROM INFUSION
500.0000 mL | INTRAVENOUS | Status: DC
  Administered 2015-08-13: 500 mL via INTRAVENOUS

## 2015-08-13 MED ORDER — FLEET ENEMA 7-19 GM/118ML RE ENEM: 1.0000 | ENEMA | Freq: Every day | RECTAL | Status: DC | PRN

## 2015-08-13 MED ORDER — LACTATED RINGERS IV SOLN
INTRAVENOUS | Status: DC
  Administered 2015-08-13: 06:00:00 via INTRAVENOUS

## 2015-08-13 MED ORDER — DIPHENHYDRAMINE HCL 25 MG PO CAPS: 25.0000 mg | ORAL_CAPSULE | Freq: Four times a day (QID) | ORAL | Status: DC | PRN

## 2015-08-13 MED ORDER — BISACODYL 10 MG RE SUPP: 10.0000 mg | Freq: Every day | RECTAL | Status: DC | PRN

## 2015-08-13 MED ORDER — IBUPROFEN 600 MG PO TABS
600.0000 mg | ORAL_TABLET | Freq: Four times a day (QID) | ORAL | Status: DC
  Administered 2015-08-13 – 2015-08-15 (×6): 600 mg via ORAL
  Filled 2015-08-13 (×6): qty 1

## 2015-08-13 MED ORDER — PENICILLIN G POTASSIUM 5000000 UNITS IJ SOLR
2.5000 10*6.[IU] | INTRAVENOUS | Status: DC
  Administered 2015-08-13 (×3): 2.5 10*6.[IU] via INTRAVENOUS
  Filled 2015-08-13 (×6): qty 2.5

## 2015-08-13 MED ORDER — EPINEPHRINE 0.15 MG/0.3ML IJ SOAJ: 0.1500 mg | INTRAMUSCULAR | Status: DC | PRN

## 2015-08-13 MED ORDER — MEASLES, MUMPS & RUBELLA VAC ~~LOC~~ INJ
0.5000 mL | INJECTION | Freq: Once | SUBCUTANEOUS | Status: DC
  Filled 2015-08-13: qty 0.5

## 2015-08-13 MED ORDER — COCONUT OIL OIL: 1.0000 "application " | TOPICAL_OIL | Status: DC | PRN

## 2015-08-13 MED ORDER — ALBUTEROL SULFATE (2.5 MG/3ML) 0.083% IN NEBU
3.0000 mL | INHALATION_SOLUTION | Freq: Four times a day (QID) | RESPIRATORY_TRACT | Status: DC | PRN
  Administered 2015-08-15: 3 mL via RESPIRATORY_TRACT
  Filled 2015-08-13: qty 3

## 2015-08-13 MED ORDER — OXYTOCIN 40 UNITS IN LACTATED RINGERS INFUSION - SIMPLE MED: 2.5000 [IU]/h | INTRAVENOUS | Status: DC | PRN

## 2015-08-13 MED ORDER — METHYLERGONOVINE MALEATE 0.2 MG/ML IJ SOLN: 0.2000 mg | INTRAMUSCULAR | Status: DC | PRN

## 2015-08-13 MED ORDER — LORATADINE 10 MG PO TABS: 10.0000 mg | ORAL_TABLET | Freq: Every day | ORAL | Status: DC | PRN

## 2015-08-13 MED ORDER — ONDANSETRON HCL 4 MG PO TABS: 4.0000 mg | ORAL_TABLET | ORAL | Status: DC | PRN

## 2015-08-13 MED ORDER — ONDANSETRON HCL 4 MG/2ML IJ SOLN: 4.0000 mg | INTRAMUSCULAR | Status: DC | PRN

## 2015-08-13 MED ORDER — DIBUCAINE 1 % RE OINT: 1.0000 "application " | TOPICAL_OINTMENT | RECTAL | Status: DC | PRN

## 2015-08-13 MED ORDER — HYDROMORPHONE HCL 1 MG/ML IJ SOLN
1.0000 mg | INTRAMUSCULAR | Status: DC | PRN
  Administered 2015-08-13: 1 mg via INTRAVENOUS
  Filled 2015-08-13: qty 1

## 2015-08-13 MED ORDER — ONDANSETRON HCL 4 MG/2ML IJ SOLN: 4.0000 mg | Freq: Four times a day (QID) | INTRAMUSCULAR | Status: DC | PRN

## 2015-08-13 MED ORDER — WITCH HAZEL-GLYCERIN EX PADS: 1.0000 "application " | MEDICATED_PAD | CUTANEOUS | Status: DC | PRN

## 2015-08-13 MED ORDER — FERROUS SULFATE 325 (65 FE) MG PO TABS
325.0000 mg | ORAL_TABLET | Freq: Two times a day (BID) | ORAL | Status: DC
  Administered 2015-08-14 – 2015-08-15 (×3): 325 mg via ORAL
  Filled 2015-08-13 (×3): qty 1

## 2015-08-13 MED ORDER — BENZOCAINE-MENTHOL 20-0.5 % EX AERO
1.0000 "application " | INHALATION_SPRAY | CUTANEOUS | Status: DC | PRN
  Administered 2015-08-13: 1 via TOPICAL
  Filled 2015-08-13: qty 56

## 2015-08-13 MED ORDER — LIDOCAINE HCL (PF) 1 % IJ SOLN
30.0000 mL | INTRAMUSCULAR | Status: DC | PRN
  Filled 2015-08-13: qty 30

## 2015-08-13 MED ORDER — OXYTOCIN 40 UNITS IN LACTATED RINGERS INFUSION - SIMPLE MED: 1.0000 m[IU]/min | INTRAVENOUS | Status: DC

## 2015-08-13 MED ORDER — FLEET ENEMA 7-19 GM/118ML RE ENEM: 1.0000 | ENEMA | RECTAL | Status: DC | PRN

## 2015-08-13 MED ORDER — SOD CITRATE-CITRIC ACID 500-334 MG/5ML PO SOLN: 30.0000 mL | ORAL | Status: DC | PRN

## 2015-08-13 MED ORDER — ZOLPIDEM TARTRATE 5 MG PO TABS: 5.0000 mg | ORAL_TABLET | Freq: Every evening | ORAL | Status: DC | PRN

## 2015-08-13 MED ORDER — FLUTICASONE PROPIONATE 50 MCG/ACT NA SUSP
2.0000 | Freq: Every day | NASAL | Status: DC
  Administered 2015-08-14: 2 via NASAL
  Filled 2015-08-13: qty 16

## 2015-08-13 MED ORDER — SENNOSIDES-DOCUSATE SODIUM 8.6-50 MG PO TABS
2.0000 | ORAL_TABLET | ORAL | Status: DC
  Administered 2015-08-13 – 2015-08-14 (×2): 2 via ORAL
  Filled 2015-08-13 (×2): qty 2

## 2015-08-13 MED ORDER — LACTATED RINGERS IV SOLN
500.0000 mL | INTRAVENOUS | Status: DC | PRN
Start: 2015-08-13 — End: 2015-08-13

## 2015-08-13 MED ORDER — OXYCODONE-ACETAMINOPHEN 5-325 MG PO TABS: 1.0000 | ORAL_TABLET | ORAL | Status: DC | PRN

## 2015-08-13 MED ORDER — ACETAMINOPHEN 325 MG PO TABS: 650.0000 mg | ORAL_TABLET | ORAL | Status: DC | PRN

## 2015-08-13 MED ORDER — METHYLERGONOVINE MALEATE 0.2 MG PO TABS: 0.2000 mg | ORAL_TABLET | ORAL | Status: DC | PRN

## 2015-08-13 MED ORDER — MISOPROSTOL 200 MCG PO TABS
50.0000 ug | ORAL_TABLET | ORAL | Status: DC
  Administered 2015-08-13 (×2): 50 ug via ORAL
  Filled 2015-08-13 (×2): qty 0.5

## 2015-08-13 MED ORDER — PRENATE PIXIE 10-0.6-0.4-200 MG PO CAPS: 1.0000 | ORAL_CAPSULE | Freq: Every day | ORAL | Status: DC

## 2015-08-13 MED ORDER — TETANUS-DIPHTH-ACELL PERTUSSIS 5-2.5-18.5 LF-MCG/0.5 IM SUSP: 0.5000 mL | Freq: Once | INTRAMUSCULAR | Status: DC

## 2015-08-13 MED ORDER — SIMETHICONE 80 MG PO CHEW: 80.0000 mg | CHEWABLE_TABLET | ORAL | Status: DC | PRN

## 2015-08-13 MED ORDER — OXYTOCIN 40 UNITS IN LACTATED RINGERS INFUSION - SIMPLE MED
2.5000 [IU]/h | INTRAVENOUS | Status: DC
  Filled 2015-08-13: qty 1000

## 2015-08-13 MED ORDER — TERBUTALINE SULFATE 1 MG/ML IJ SOLN
0.2500 mg | Freq: Once | INTRAMUSCULAR | Status: DC | PRN
  Filled 2015-08-13: qty 1

## 2015-08-13 MED ORDER — SODIUM CHLORIDE 0.9% FLUSH: 3.0000 mL | INTRAVENOUS | Status: DC | PRN

## 2015-08-13 MED ORDER — MEDROXYPROGESTERONE ACETATE 150 MG/ML IM SUSP: 150.0000 mg | INTRAMUSCULAR | Status: DC | PRN

## 2015-08-13 NOTE — H&P (Signed)
LABOR AND DELIVERY ADMISSION HISTORY AND PHYSICAL NOTE  Katie Riggs is a 30 y.o. female G2P1001 with IUP at [redacted]w[redacted]d by LMP presenting for PROM@2335 .   She reports positive fetal movement. She denies leakage of fluid or vaginal bleeding.  Prenatal History/Complications:  Past Medical History: Past Medical History:  Diagnosis Date  . Asthma   . IUD (intrauterine device) in place    Mirena.  States it's been out since June 2015    Past Surgical History: Past Surgical History:  Procedure Laterality Date  . WISDOM TOOTH EXTRACTION      Obstetrical History: OB History    Gravida Para Term Preterm AB Living   2 1 1  0 0 1   SAB TAB Ectopic Multiple Live Births   0 0 0 0        Social History: Social History   Social History  . Marital status: Single    Spouse name: N/A  . Number of children: N/A  . Years of education: N/A   Social History Main Topics  . Smoking status: Never Smoker  . Smokeless tobacco: Never Used  . Alcohol use No     Comment: ocassionally beer or wine  . Drug use: No  . Sexual activity: Yes    Partners: Male    Birth control/ protection: None   Other Topics Concern  . None   Social History Narrative  . None    Family History: Family History  Problem Relation Age of Onset  . Stroke Mother   . Hypertension Father   . Diabetes Father     Allergies: Allergies  Allergen Reactions  . Bee Venom Anaphylaxis  . Latex Anaphylaxis and Rash    Prescriptions Prior to Admission  Medication Sig Dispense Refill Last Dose  . acetaminophen (TYLENOL) 500 MG tablet Take 500 mg by mouth every 6 (six) hours as needed for headache.    Taking  . albuterol (PROVENTIL HFA;VENTOLIN HFA) 108 (90 Base) MCG/ACT inhaler Inhale 2 puffs into the lungs every 6 (six) hours as needed for wheezing or shortness of breath. 1 Inhaler 2 Taking  . diphenhydrAMINE (BENADRYL) 25 MG tablet Take 25-50 mg by mouth every 6 (six) hours as needed for allergies.   Taking  .  EPINEPHrine (EPIPEN JR) 0.15 MG/0.3ML injection Inject 0.3 mLs (0.15 mg total) into the muscle as needed for anaphylaxis. 2 each PRN Taking  . fluconazole (DIFLUCAN) 100 MG tablet Take 1 tablet (100 mg total) by mouth once. Repeat dose in 48-72 hour. 3 tablet 0 Taking  . fluticasone (FLONASE) 50 MCG/ACT nasal spray Place 2 sprays into both nostrils daily. 16 g 2 Taking  . loratadine (CLARITIN) 10 MG tablet Take 1 tablet (10 mg total) by mouth daily. 30 tablet 12 Taking  . metoCLOPramide (REGLAN) 10 MG tablet Take 1 tablet (10 mg total) by mouth 4 (four) times daily. 120 tablet 4 Taking  . omeprazole (PRILOSEC) 20 MG capsule Take 1 capsule (20 mg total) by mouth 2 (two) times daily before a meal. 60 capsule 5 Taking  . Prenat-FeAsp-Meth-FA-DHA w/o A (PRENATE PIXIE) 10-0.6-0.4-200 MG CAPS Take 1 tablet by mouth daily. 30 capsule 12 Taking  . promethazine (PHENERGAN) 50 MG tablet Take 1 tablet (50 mg total) by mouth every 6 (six) hours as needed for nausea or vomiting. 30 tablet 0 Taking  . traMADol (ULTRAM) 50 MG tablet Take 1-2 tablets (50-100 mg total) by mouth every 6 (six) hours as needed for severe pain. 30 tablet 0  Taking     Review of Systems   All systems reviewed and negative except as stated in HPI  Blood pressure (!) 97/55, pulse 83, temperature 98.5 F (36.9 C), temperature source Oral, resp. rate 16, height  (1.727 m), last menstrual period 11/24/2014, SpO2 100 %. General appearance: alert, cooperative and no distress Lungs: clear to auscultation bilaterally Heart: regular rate and rhythm Abdomen: soft, non-tender; bowel sounds normal Extremities: No calf swelling or tenderness Presentation: cephalic Fetal monitoring: cat 1 Uterine activity: irregular contractiosn Dilation: 1 Effacement (%): Thick Station: -3 Exam by:: Sharen Hint RNC   Prenatal labs: ABO, Rh: --/--/A POS, A POS (07/23 0030) Antibody: NEG (07/23 0030) Rubella: !Error! RPR: Non Reactive (05/23  1043)  HBsAg: NEGATIVE (01/17 1019)  HIV: Non Reactive (05/23 1043)  GBS: Positive (07/07 1645)  1 hr Glucola: passed Genetic screening:  Normal AFP and NIPs Anatomy US: left polydactly and EIF  Prenatal Transfer Tool  Maternal Diabetes: No Genetic Screening: Normal Maternal Ultrasounds/Referrals: Abnormal:  Findings:   Isolated EIF (echogenic intracardiac focus), Other:left polydactly Fetal Ultrasounds or other Referrals:  Referred to Materal Fetal Medicine  Maternal Substance Abuse:  No Significant Maternal Medications:  None Significant Maternal Lab Results: None  Results for orders placed or performed during the hospital encounter of 08/12/15 (from the past 24 hour(s))  Fern Test   Collection Time: 08/13/15 12:06 AM  Result Value Ref Range   POCT Fern Test Positive = ruptured amniotic membanes   CBC   Collection Time: 08/13/15 12:30 AM  Result Value Ref Range   WBC 8.0 4.0 - 10.5 K/uL   RBC 3.93 3.87 - 5.11 MIL/uL   Hemoglobin 11.0 (L) 12.0 - 15.0 g/dL   HCT 16.1 (L) 09.6 - 04.5 %   MCV 81.7 78.0 - 100.0 fL   MCH 28.0 26.0 - 34.0 pg   MCHC 34.3 30.0 - 36.0 g/dL   RDW 40.9 81.1 - 91.4 %   Platelets 269 150 - 400 K/uL  Type and screen   Collection Time: 08/13/15 12:30 AM  Result Value Ref Range   ABO/RH(D) A POS    Antibody Screen NEG    Sample Expiration 08/16/2015   ABO/Rh   Collection Time: 08/13/15 12:30 AM  Result Value Ref Range   ABO/RH(D) A POS     Patient Active Problem List   Diagnosis Date Noted  . ROM (rupture of membranes), premature 08/13/2015  . Gestational transient thyrotoxicosis 02/17/2015  . Supervision of other normal pregnancy, antepartum 02/07/2015  . Vaginal discharge 02/19/2012    Assessment: Katie Riggs is a 30 y.o. G2P1001 at [redacted]w[redacted]d here for PROM@2335 .  #Labor: consider augmentation #Pain: waterbirth #FWB: Cat 1 #ID:  GBS pos #MOF: breast #MOC: IUD  Loni Muse 08/13/2015, 6:03 AM   I spoke with and examined  patient and agree with resident/PA/SNM's note and plan of care.  Cheral Marker, CNM, WHNP-BC 08/13/2015 7:00 AM

## 2015-08-13 NOTE — Anesthesia Pain Management Evaluation Note (Signed)
  CRNA Pain Management Visit Note  Patient: Katie Riggs, 30 y.o., female  "Hello I am a member of the anesthesia team at [facility]. We have an anesthesia team available at all times to provide care throughout the hospital, including epidural management and anesthesia for C-section. I don't know your plan for the delivery whether it a natural birth, water birth, IV sedation, nitrous supplementation, doula or epidural, but we want to meet your pain goals."   1.Was your pain managed to your expectations on prior hospitalizations?   No prior hospitalizations  2.What is your expectation for pain management during this hospitalization?     Water tub  3.How can we help you reach that goal? Water birth  Record the patient's initial score and the patient's pain goal.   Pain: 0  Pain Goal: 10 [facility] [place] wants you to be able to say your pain was always managed very well.  Cephus Shelling 08/13/2015

## 2015-08-13 NOTE — Progress Notes (Signed)
Message left x2

## 2015-08-13 NOTE — Progress Notes (Signed)
Message left x3

## 2015-08-13 NOTE — Progress Notes (Signed)
Called to notify of pt arrival in MAU. Message left requesting call back

## 2015-08-13 NOTE — Progress Notes (Signed)
Katie Riggs is a 30 y.o. G2P1001 at [redacted]w[redacted]d by LMP admitted for rupture of membranes  Subjective:   Objective: BP 113/72 (BP Location: Right Arm)   Pulse 98   Temp 97.7 F (36.5 C) (Oral)   Resp 18   Ht 5\' 8"  (1.727 m)   LMP 11/24/2014   SpO2 100%  I/O last 3 completed shifts: In: 100 [IV Piggyback:100] Out: -  No intake/output data recorded.  FHT:  FHR: 135 bpm, variability: moderate,  accelerations:  Present,  decelerations:  Absent UC:   irregular, every 5-15 minutes SVE:   Dilation: 1 Effacement (%): 50 Station: -3 Exam by:: R Berlin Viereck CMN  Labs: Lab Results  Component Value Date   WBC 8.0 08/13/2015   HGB 11.0 (L) 08/13/2015   HCT 32.1 (L) 08/13/2015   MCV 81.7 08/13/2015   PLT 269 08/13/2015    Assessment / Plan: SROM @1cm .  No change in dilation overnight.  Foley bulb placed and Cytotec started.  Patient encouraged to move around to stimulate labor.   Labor: Early latent phase with SROM Preeclampsia:  no signs or symptoms of toxicity Fetal Wellbeing:  Category I Pain Control:  Water tub I/D:  n/a Anticipated MOD:  NSVD  Roe Coombs, CNM 08/13/2015, 8:57 AM

## 2015-08-13 NOTE — Progress Notes (Signed)
Notified of pt arrival in MAU and inability to reach CNM. Will put in admission orders

## 2015-08-13 NOTE — Progress Notes (Signed)
Penicillin -G Potassium  5 MU D5W  transfused @0200  hrs

## 2015-08-14 ENCOUNTER — Encounter (HOSPITAL_COMMUNITY): Payer: Self-pay | Admitting: *Deleted

## 2015-08-14 LAB — CBC
HCT: 32.2 % — ABNORMAL LOW (ref 36.0–46.0)
Hemoglobin: 11 g/dL — ABNORMAL LOW (ref 12.0–15.0)
MCH: 27.6 pg (ref 26.0–34.0)
MCHC: 34.2 g/dL (ref 30.0–36.0)
MCV: 80.7 fL (ref 78.0–100.0)
PLATELETS: 273 10*3/uL (ref 150–400)
RBC: 3.99 MIL/uL (ref 3.87–5.11)
RDW: 13.4 % (ref 11.5–15.5)
WBC: 15.6 10*3/uL — AB (ref 4.0–10.5)

## 2015-08-14 NOTE — Progress Notes (Signed)
Post Partum Day 1  Subjective:  Katie Riggs is a 30 y.o. Z3G9924 [redacted]w[redacted]d s/p SVD.  No acute events overnight.  Pt denies problems with ambulating, voiding or po intake.  She denies nausea or vomiting.  Pain is well controlled.  She has had flatus. She has not had bowel movement.  Lochia Minimal.  Plan for birth control is IUD.  Method of Feeding: Breastfeeding  Objective: BP 106/73 (BP Location: Left Arm)   Pulse 78   Temp 97.9 F (36.6 C) (Oral)   Resp 20   Ht 5\' 8"  (1.727 m)   LMP 11/24/2014   SpO2 100%   Breastfeeding? Unknown   Physical Exam:  General: alert, cooperative and no distress Lochia:normal flow Chest: CTAB Heart: RRR no m/r/g Abdomen: +BS, soft, nontender, fundus firm below umbilicus Uterine Fundus: firm, appropriately tender to palpation DVT Evaluation: No evidence of DVT seen on physical exam. Extremities: trace edema   Recent Labs  08/13/15 0030 08/14/15 0551  HGB 11.0* 11.0*  HCT 32.1* 32.2*    Assessment/Plan:  ASSESSMENT: Katie Riggs is a 30 y.o. Q6S3419 [redacted]w[redacted]d ppd #1 s/p NSVD doing well.   Plan for discharge tomorrow, Breastfeeding and Contraception IUD-Mirena   LOS: 1 day   Clyde Canterbury 08/14/2015, 9:09 AM  OB FELLOW MEDICAL STUDENT NOTE ATTESTATION  I have seen and examined this patient. Note this is a Psychologist, occupational note and as such does not necessarily reflect the patient's plan of care. Please see note by Ernestina Penna MD for this date of service.    Ernestina Penna 08/14/2015, 11:15 AM

## 2015-08-14 NOTE — Progress Notes (Signed)
POSTPARTUM PROGRESS NOTE  Post Partum Day 1 Subjective:  Katie Riggs is a 30 y.o. P5P0051 [redacted]w[redacted]d s/p SVD.  No acute events overnight.  Pt denies problems with ambulating, voiding or po intake.  She denies nausea or vomiting.  Pain is well controlled.  She has had flatus. She has not had bowel movement.  Lochia Minimal.   Objective: Blood pressure 106/73, pulse 78, temperature 97.9 F (36.6 C), temperature source Oral, resp. rate 20, height 5\' 8"  (1.727 m), last menstrual period 11/24/2014, SpO2 100 %, unknown if currently breastfeeding.  Physical Exam:  General: alert, cooperative and no distress Lochia:normal flow Chest: CTAB Heart: RRR no m/r/g Abdomen: +BS, soft, nontender,  Uterine Fundus: firm, appropriately tender DVT Evaluation: No calf swelling or tenderness Extremities: trace edema   Recent Labs  08/13/15 0030 08/14/15 0551  HGB 11.0* 11.0*  HCT 32.1* 32.2*    Assessment/Plan:  ASSESSMENT: Katie Riggs is a 30 y.o. T0Y1117 [redacted]w[redacted]d s/p SVD with birth tub although did not deliver in tub.   Plan for discharge tomorrow, Breastfeeding and Contraception IUS mirena   LOS: 1 day   Katie Riggs 08/14/2015, 7:57 AM

## 2015-08-14 NOTE — Lactation Note (Signed)
This note was copied from a baby's chart. Lactation Consultation Note Initiakl consult with this second time mom and term baby. Mom wants to breast and formula feed. I explained to mom how she has lots of easily expressed clostrum, and  The baby does not need formula at this time. Momsaid this is how she chooses to feed her baby, so I gave her the formula mount sheet, explained ti to her, and she carefully measured 7 ml's of formula. She gave the bottle to dad to feed this sleeping baby, and the baby did not sue. DEP also set up, since mom wants to pump and bottle feed, along with latching. Mom kows to call for questions/concerns.    Patient Name: Katie Riggs Today's Date: 08/14/2015 Reason for consult: Initial assessment   Maternal Data Formula Feeding for Exclusion: Yes Reason for exclusion: Mother's choice to formula and breast feed on admission Has patient been taught Hand Expression?: Yes Does the patient have breastfeeding experience prior to this delivery?: Yes  Feeding    LATCH Score/Interventions    Audible Swallowing:  (easily expressed flow of clear colostrum)                 Lactation Tools Discussed/Used Pump Review: Other (comment);Milk Storage (mom will pump as desired, is also breast feeidng)   Consult Status Consult Status: Follow-up Date: 08/15/15 Follow-up type: In-patient    Alfred Levins 08/14/2015, 9:18 AM

## 2015-08-15 MED ORDER — COCONUT OIL OIL: 1.0000 "application " | TOPICAL_OIL | 99 refills | Status: DC | PRN

## 2015-08-15 MED ORDER — IBUPROFEN 600 MG PO TABS: 600.0000 mg | ORAL_TABLET | Freq: Four times a day (QID) | ORAL | 2 refills | Status: DC

## 2015-08-15 MED ORDER — SENNOSIDES-DOCUSATE SODIUM 8.6-50 MG PO TABS: 2.0000 | ORAL_TABLET | Freq: Two times a day (BID) | ORAL | 2 refills | Status: DC

## 2015-08-15 MED ORDER — BISACODYL 10 MG RE SUPP
10.0000 mg | Freq: Every day | RECTAL | 0 refills | Status: DC | PRN
Start: 2015-08-15 — End: 2015-11-16

## 2015-08-15 MED ORDER — OXYCODONE-ACETAMINOPHEN 5-325 MG PO TABS: 1.0000 | ORAL_TABLET | ORAL | 0 refills | Status: DC | PRN

## 2015-08-15 NOTE — Progress Notes (Signed)
Post Partum Day #2 Subjective: no complaints, up ad lib, voiding, tolerating PO and + flatus  Objective: Blood pressure 126/74, pulse (!) 50, temperature 98.1 F (36.7 C), temperature source Oral, resp. rate 18, height 5\' 7"  (1.702 m), weight 222 lb (100.7 kg), last menstrual period 11/24/2014, SpO2 100 %, unknown if currently breastfeeding.  Physical Exam:  General: alert, cooperative and no distress Lochia: appropriate Uterine Fundus: firm Incision: none DVT Evaluation: No evidence of DVT seen on physical exam. Negative Homan's sign. No cords or calf tenderness. No significant calf/ankle edema.   Recent Labs  08/13/15 0030 08/14/15 0551  HGB 11.0* 11.0*  HCT 32.1* 32.2*    Assessment/Plan: Discharge home, Breastfeeding and Contraception planning Mirena 6 wks PP   LOS: 2 days   Roe Coombs, CNM 08/15/2015, 8:25 AM

## 2015-08-15 NOTE — Lactation Note (Signed)
This note was copied from a baby's chart. Lactation Consultation Note Brief consult with this experienced breast feeding mom. Mom has very ful breasts this morning, Denies need for ce, but is getting ready to pump with DEP. Mom is bottle feeding EBm,formula, and si also latching baby to breast. Mom denies questions/concerns prior to discharge to home.  Patient Name: Katie Riggs Today's Date: 08/15/2015 Reason for consult: Follow-up assessment   Maternal Data    Feeding Feeding Type: Bottle Fed - Formula Nipple Type: Slow - flow Length of feed: 10 min  LATCH Score/Interventions          Comfort (Breast/Nipple): Filling, red/small blisters or bruises, mild/mod discomfort  Problem noted: Filling        Lactation Tools Discussed/Used     Consult Status      Alfred Levins 08/15/2015, 8:29 AM

## 2015-08-15 NOTE — Discharge Summary (Signed)
Obstetric Discharge Summary Reason for Admission: onset of labor and rupture of membranes Prenatal Procedures: ultrasound Intrapartum Procedures: spontaneous vaginal delivery Postpartum Procedures: none Complications-Operative and Postpartum: none Hemoglobin  Date Value Ref Range Status  08/14/2015 11.0 (L) 12.0 - 15.0 g/dL Final   HCT  Date Value Ref Range Status  08/14/2015 32.2 (L) 36.0 - 46.0 % Final   Hematocrit  Date Value Ref Range Status  06/13/2015 34.8 34.0 - 46.6 % Final    Physical Exam:  General: alert, cooperative and no distress Lochia: appropriate Uterine Fundus: firm Incision: none DVT Evaluation: No evidence of DVT seen on physical exam. Negative Homan's sign. No cords or calf tenderness. No significant calf/ankle edema.  Discharge Diagnoses: Term Pregnancy-delivered  Discharge Information: Date: 08/15/2015 Activity: pelvic rest Diet: routine Medications: PNV, Ibuprofen, Colace and Percocet Condition: stable Instructions: refer to practice specific booklet Discharge to: home Follow-up Information    Roe Coombs, CNM. Schedule an appointment as soon as possible for a visit in 2 week(s).   Specialty:  Certified Nurse Midwife Contact information: [address] [place] [address] [phone]           Newborn Data: Live born female  Birth Weight: 6 lb 1 oz (2750 g) APGAR: 8, 9  Home with mother.  Roe Coombs, CNM 08/15/2015, 8:30 AM

## 2015-08-16 ENCOUNTER — Ambulatory Visit (HOSPITAL_COMMUNITY): Payer: BLUE CROSS/BLUE SHIELD

## 2015-08-17 ENCOUNTER — Encounter: Payer: BLUE CROSS/BLUE SHIELD | Admitting: Obstetrics and Gynecology

## 2015-08-23 ENCOUNTER — Telehealth: Payer: Self-pay | Admitting: Medical

## 2015-08-23 NOTE — Telephone Encounter (Signed)
Please call and congratulate her on her pregnancy from us.   Hope she is doing well.  

## 2015-08-24 NOTE — Telephone Encounter (Signed)
Pt is aware.  

## 2015-09-07 ENCOUNTER — Ambulatory Visit: Payer: BLUE CROSS/BLUE SHIELD | Admitting: Certified Nurse Midwife

## 2015-09-08 ENCOUNTER — Other Ambulatory Visit: Payer: Self-pay | Admitting: Certified Nurse Midwife

## 2015-09-08 ENCOUNTER — Encounter: Payer: Self-pay | Admitting: Certified Nurse Midwife

## 2015-09-08 ENCOUNTER — Ambulatory Visit (INDEPENDENT_AMBULATORY_CARE_PROVIDER_SITE_OTHER): Payer: BLUE CROSS/BLUE SHIELD | Admitting: Certified Nurse Midwife

## 2015-09-08 ENCOUNTER — Other Ambulatory Visit: Payer: Self-pay | Admitting: *Deleted

## 2015-09-08 DIAGNOSIS — R319 Hematuria, unspecified: Secondary | ICD-10-CM

## 2015-09-08 LAB — POCT URINALYSIS DIPSTICK
Bilirubin, UA: NEGATIVE
Glucose, UA: NEGATIVE
Ketones, UA: NEGATIVE
LEUKOCYTES UA: NEGATIVE
Nitrite, UA: NEGATIVE
Spec Grav, UA: 1.015
UROBILINOGEN UA: NEGATIVE
pH, UA: 5

## 2015-09-08 NOTE — Progress Notes (Signed)
Subjective:     Katie Riggs is a 30 y.o. female who presents for a postpartum visit. She is 2 weeks postpartum following a spontaneous vaginal delivery. I have fully reviewed the prenatal and intrapartum course. The delivery was at 37.3 gestational weeks. Outcome: spontaneous vaginal delivery. Anesthesia: epidural. Postpartum course has been normal. Baby's course has been normal. Baby is feeding by breast. Bleeding staining only. Bowel function is normal. Bladder function is normal. Patient is not sexually active. Contraception method is abstinence. Postpartum depression screening: negative.  Tobacco, alcohol and substance abuse history reviewed.  Adult immunizations reviewed including TDAP, rubella and varicella.  The following portions of the patient's history were reviewed and updated as appropriate: allergies, current medications, past family history, past medical history, past social history, past surgical history and problem list.  Review of Systems Pertinent items noted in HPI and remainder of comprehensive ROS otherwise negative.   Objective:    BP 115/78   Pulse 78   Temp 98.2 F (36.8 C)   Wt 198 lb (89.8 kg)   BMI 31.01 kg/m   General:  alert, cooperative and no distress   Breasts:  inspection negative, no nipple discharge or bleeding, no masses or nodularity palpable  Lungs: clear to auscultation bilaterally  Heart:  regular rate and rhythm, S1, S2 normal, no murmur, click, rub or gallop  Abdomen: soft, non-tender; bowel sounds normal; no masses,  no organomegaly   Vulva:  not evaluated  Rectal Exam: Not performed.          50% of 15 min visit spent on counseling and coordination of care.  Assessment:     Normal 2 week postpartum exam. Pap smear not done at today's visit.  Plan:    1. Contraception: abstinence 2.  Planning Mirena IUD 3. Follow up in: 4 weeks or as needed.  2hr GTT for h/o GDM/screening for DM q 3 yrs per ADA recommendations Preconception  counseling provided Healthy lifestyle practices reviewed

## 2015-09-08 NOTE — Addendum Note (Signed)
Addended by: Luella CookMCINTYRE, DIRECE E on: 09/08/2015 04:28 PM   Modules accepted: Orders

## 2015-09-10 LAB — URINE CULTURE: ORGANISM ID, BACTERIA: NO GROWTH

## 2015-10-06 ENCOUNTER — Ambulatory Visit (INDEPENDENT_AMBULATORY_CARE_PROVIDER_SITE_OTHER): Payer: BLUE CROSS/BLUE SHIELD | Admitting: Obstetrics

## 2015-10-06 ENCOUNTER — Ambulatory Visit: Payer: BLUE CROSS/BLUE SHIELD | Admitting: Certified Nurse Midwife

## 2015-10-06 ENCOUNTER — Encounter: Payer: Self-pay | Admitting: Obstetrics

## 2015-10-06 DIAGNOSIS — Z30014 Encounter for initial prescription of intrauterine contraceptive device: Secondary | ICD-10-CM

## 2015-10-06 DIAGNOSIS — Z30430 Encounter for insertion of intrauterine contraceptive device: Secondary | ICD-10-CM | POA: Diagnosis not present

## 2015-10-06 DIAGNOSIS — Z01419 Encounter for gynecological examination (general) (routine) without abnormal findings: Secondary | ICD-10-CM

## 2015-10-06 NOTE — Progress Notes (Signed)
Subjective:     Katie Riggs is a 30 y.o. female who presents for a postpartum visit. She is 8 weeks postpartum following a spontaneous vaginal delivery. I have fully reviewed the prenatal and intrapartum course. The delivery was at 37 gestational weeks. Outcome: spontaneous vaginal delivery. Anesthesia: none. Postpartum course has been normal. Baby's course has been normal. Baby is feeding by bottle - Similac Advance. Bleeding no bleeding. Bowel function is normal. Bladder function is normal. Patient is not sexually active. Contraception method is abstinence. Postpartum depression screening: negative.  Tobacco, alcohol and substance abuse history reviewed.  Adult immunizations reviewed including TDAP, rubella and varicella.  The following portions of the patient's history were reviewed and updated as appropriate: allergies, current medications, past family history, past medical history, past social history, past surgical history and problem list.  Review of Systems A comprehensive review of systems was negative.   Objective:    BP 130/88   Pulse 72   Wt 200 lb (90.7 kg)   LMP 09/25/2015   BMI 31.32 kg/m   General:  alert and no distress   Breasts:  inspection negative, no nipple discharge or bleeding, no masses or nodularity palpable  Lungs: clear to auscultation bilaterally  Heart:  regular rate and rhythm, S1, S2 normal, no murmur, click, rub or gallop  Abdomen: soft, non-tender; bowel sounds normal; no masses,  no organomegaly   Vulva:  normal  Vagina: normal vagina  Cervix:  no cervical motion tenderness  Corpus: normal size, contour, position, consistency, mobility, non-tender  Adnexa:  no mass, fullness, tenderness  Rectal Exam: Not performed.          50% of 30 min visit spent on counseling and coordination of care.   Assessment:     Normal postpartum exam. Pap smear done at today's visit.     Contraceptive counseling and advice.  Wants Mirena IUD.  Plan:    1.  Contraception: IUD 2. Mirena IUD inserted ( see note ) 3. Follow up in: 6 weeks or as needed.   Healthy lifestyle practices reviewed    IUD Insertion Procedure Note  Pre-operative Diagnosis: Desire Contraception  Post-operative Diagnosis: same  Indications: contraception  Procedure Details  Urine pregnancy test was done in office and result was negative.  The risks (including infection, bleeding, pain, and uterine perforation) and benefits of the procedure were explained to the patient and Written informed consent was obtained.    Cervix cleansed with Betadine. Uterus sounded to 8 cm. IUD inserted without difficulty. String visible and trimmed. Patient tolerated procedure well.  IUD Information: Mirena, Lot # [id number], Expiration date 04 / 20.  Condition: Stable  Complications: None  Plan:  The patient was advised to call for any fever or for prolonged or severe pain or bleeding. She was advised to use NSAID as needed for mild to moderate pain.   Attending Physician Documentation: I was present for or participated in the entire procedure, including opening and closing.

## 2015-10-11 LAB — PAP IG W/ RFLX HPV ASCU: PAP Smear Comment: 0

## 2015-10-12 ENCOUNTER — Encounter: Payer: Self-pay | Admitting: *Deleted

## 2015-10-12 ENCOUNTER — Telehealth: Payer: Self-pay | Admitting: *Deleted

## 2015-10-12 ENCOUNTER — Other Ambulatory Visit: Payer: Self-pay | Admitting: Obstetrics

## 2015-10-12 DIAGNOSIS — B3731 Acute candidiasis of vulva and vagina: Secondary | ICD-10-CM

## 2015-10-12 DIAGNOSIS — B9689 Other specified bacterial agents as the cause of diseases classified elsewhere: Secondary | ICD-10-CM

## 2015-10-12 DIAGNOSIS — N760 Acute vaginitis: Secondary | ICD-10-CM

## 2015-10-12 DIAGNOSIS — B373 Candidiasis of vulva and vagina: Secondary | ICD-10-CM

## 2015-10-12 LAB — NUSWAB VG+, CANDIDA 6SP
ATOPOBIUM VAGINAE: HIGH {score} — AB
BVAB 2: HIGH {score} — AB
CANDIDA KRUSEI, NAA: NEGATIVE
CANDIDA TROPICALIS, NAA: NEGATIVE
CHLAMYDIA TRACHOMATIS, NAA: NEGATIVE
Candida albicans, NAA: NEGATIVE
Candida glabrata, NAA: NEGATIVE
Candida lusitaniae, NAA: NEGATIVE
Candida parapsilosis, NAA: NEGATIVE
Megasphaera 1: HIGH Score — AB
NEISSERIA GONORRHOEAE, NAA: NEGATIVE
TRICH VAG BY NAA: NEGATIVE

## 2015-10-12 MED ORDER — FLUCONAZOLE 150 MG PO TABS: 150.0000 mg | ORAL_TABLET | Freq: Once | ORAL | 2 refills | Status: AC

## 2015-10-12 MED ORDER — METRONIDAZOLE 500 MG PO TABS: 500.0000 mg | ORAL_TABLET | Freq: Two times a day (BID) | ORAL | 2 refills | Status: DC

## 2015-10-12 NOTE — Telephone Encounter (Signed)
Patient phone not working.Letter sent to call office for lab results.

## 2015-10-12 NOTE — Telephone Encounter (Signed)
Patient returned call and was given her lab results.Cautioned not to drink alcohol with this medication it can make her vomit.

## 2015-11-16 ENCOUNTER — Ambulatory Visit: Payer: BLUE CROSS/BLUE SHIELD | Admitting: Obstetrics

## 2015-11-16 ENCOUNTER — Ambulatory Visit (INDEPENDENT_AMBULATORY_CARE_PROVIDER_SITE_OTHER): Payer: BLUE CROSS/BLUE SHIELD | Admitting: Certified Nurse Midwife

## 2015-11-16 ENCOUNTER — Encounter: Payer: Self-pay | Admitting: Certified Nurse Midwife

## 2015-11-16 VITALS — BP 122/81 | HR 88 | Wt 202.0 lb

## 2015-11-16 DIAGNOSIS — Z30431 Encounter for routine checking of intrauterine contraceptive device: Secondary | ICD-10-CM | POA: Diagnosis not present

## 2015-11-16 NOTE — Progress Notes (Signed)
Patient ID: Katie Riggs, female   DOB: 1985/09/08, 30 y.o.   MRN: [id number]  Chief Complaint  Patient presents with  . Follow-up    IUD check    HPI Katie Riggs is a 30 y.o. female.  Patient here for IUD check up.  No complaints.  Had pap smear 09/2015: normal.   HPI  Past Medical History:  Diagnosis Date  . Asthma   . IUD (intrauterine device) in place    Mirena.  States it's been out since June 2015    Past Surgical History:  Procedure Laterality Date  . WISDOM TOOTH EXTRACTION      Family History  Problem Relation Age of Onset  . Stroke Mother   . Hypertension Father   . Diabetes Father     Social History Social History  Substance Use Topics  . Smoking status: Never Smoker  . Smokeless tobacco: Never Used  . Alcohol use No     Comment: ocassionally beer or wine    Allergies  Allergen Reactions  . Bee Venom Anaphylaxis  . Latex Anaphylaxis and Rash    Current Outpatient Prescriptions  Medication Sig Dispense Refill  . fluticasone (FLONASE) 50 MCG/ACT nasal spray Place 2 sprays into both nostrils daily. 16 g 2  . loratadine (CLARITIN) 10 MG tablet Take 1 tablet (10 mg total) by mouth daily. 30 tablet 12  . albuterol (PROVENTIL HFA;VENTOLIN HFA) 108 (90 Base) MCG/ACT inhaler Inhale 2 puffs into the lungs every 6 (six) hours as needed for wheezing or shortness of breath. (Patient not taking: Reported on 11/16/2015) 1 Inhaler 2  . EPINEPHrine (EPIPEN JR) 0.15 MG/0.3ML injection Inject 0.3 mLs (0.15 mg total) into the muscle as needed for anaphylaxis. (Patient not taking: Reported on 11/16/2015) 2 each PRN  . ibuprofen (ADVIL,MOTRIN) 600 MG tablet Take 1 tablet (600 mg total) by mouth every 6 (six) hours. (Patient not taking: Reported on 11/16/2015) 120 tablet 2  . Prenat-FeAsp-Meth-FA-DHA w/o A (PRENATE PIXIE) 10-0.6-0.4-200 MG CAPS Take 1 tablet by mouth daily. (Patient not taking: Reported on 11/16/2015) 30 capsule 12   No current  facility-administered medications for this visit.     Review of Systems Review of Systems Constitutional: negative for fatigue and weight loss Respiratory: negative for cough and wheezing Cardiovascular: negative for chest pain, fatigue and palpitations Gastrointestinal: negative for abdominal pain and change in bowel habits Genitourinary:negative Integument/breast: negative for nipple discharge Musculoskeletal:negative for myalgias Neurological: negative for gait problems and tremors Behavioral/Psych: negative for abusive relationship, depression Endocrine: negative for temperature intolerance     Blood pressure 122/81, pulse 88, weight 202 lb (91.6 kg), unknown if currently breastfeeding.  Physical Exam Physical Exam General:   alert  Skin:   no rash or abnormalities  Lungs:   clear to auscultation bilaterally  Heart:   regular rate and rhythm, S1, S2 normal, no murmur, click, rub or gallop  Breasts:   deferred  Abdomen:  normal findings: no organomegaly, soft, non-tender and no hernia  Pelvis:  External genitalia: normal general appearance Urinary system: urethral meatus normal and bladder without fullness, nontender Vaginal: normal without tenderness, induration or masses Cervix: normal appearance, + IUD strings present.  Adnexa: normal bimanual exam Uterus: anteverted and non-tender, normal size    50% of 15 min visit spent on counseling and coordination of care.   Data Reviewed Previous medical hx, meds, labs  Assessment     IUD check up    Plan    No orders of  the defined types were placed in this encounter.  No orders of the defined types were placed in this encounter.   Follow up as needed or in 1 year for annual exam.

## 2016-05-21 ENCOUNTER — Encounter: Payer: Self-pay | Admitting: Certified Nurse Midwife

## 2016-05-23 ENCOUNTER — Other Ambulatory Visit: Payer: Self-pay | Admitting: Certified Nurse Midwife

## 2016-05-23 DIAGNOSIS — B3731 Acute candidiasis of vulva and vagina: Secondary | ICD-10-CM

## 2016-05-23 DIAGNOSIS — B373 Candidiasis of vulva and vagina: Secondary | ICD-10-CM

## 2016-05-23 MED ORDER — FLUCONAZOLE 200 MG PO TABS: 200.0000 mg | ORAL_TABLET | Freq: Once | ORAL | 0 refills | Status: AC

## 2017-08-22 ENCOUNTER — Ambulatory Visit (INDEPENDENT_AMBULATORY_CARE_PROVIDER_SITE_OTHER): Payer: BLUE CROSS/BLUE SHIELD | Admitting: Obstetrics

## 2017-08-22 ENCOUNTER — Encounter: Payer: Self-pay | Admitting: Obstetrics

## 2017-08-22 VITALS — BP 117/84 | HR 96 | Ht 67.0 in | Wt 170.8 lb

## 2017-08-22 DIAGNOSIS — N946 Dysmenorrhea, unspecified: Secondary | ICD-10-CM | POA: Diagnosis not present

## 2017-08-22 DIAGNOSIS — Z30431 Encounter for routine checking of intrauterine contraceptive device: Secondary | ICD-10-CM | POA: Diagnosis not present

## 2017-08-22 DIAGNOSIS — Z113 Encounter for screening for infections with a predominantly sexual mode of transmission: Secondary | ICD-10-CM | POA: Diagnosis not present

## 2017-08-22 DIAGNOSIS — N898 Other specified noninflammatory disorders of vagina: Secondary | ICD-10-CM

## 2017-08-22 DIAGNOSIS — Z124 Encounter for screening for malignant neoplasm of cervix: Secondary | ICD-10-CM

## 2017-08-22 DIAGNOSIS — Z01419 Encounter for gynecological examination (general) (routine) without abnormal findings: Secondary | ICD-10-CM | POA: Diagnosis not present

## 2017-08-22 DIAGNOSIS — Z1151 Encounter for screening for human papillomavirus (HPV): Secondary | ICD-10-CM

## 2017-08-22 MED ORDER — IBUPROFEN 800 MG PO TABS: 800.0000 mg | ORAL_TABLET | Freq: Three times a day (TID) | ORAL | 5 refills | Status: AC | PRN

## 2017-08-22 NOTE — Progress Notes (Signed)
Pt here for AEX. Pt desire STD screening.

## 2017-08-22 NOTE — Progress Notes (Signed)
Subjective:        Katie Riggs is a 32 y.o. female here for a routine exam.  Current complaints: None.    Personal health questionnaire:  Is patient Ashkenazi Jewish, have a family history of breast and/or ovarian cancer: no Is there a family history of uterine cancer diagnosed at age < 10, gastrointestinal cancer, urinary tract cancer, family member who is a Personnel officer syndrome-associated carrier: no Is the patient overweight and hypertensive, family history of diabetes, personal history of gestational diabetes, preeclampsia or PCOS: no Is patient over 19, have PCOS,  family history of premature CHD under age 30, diabetes, smoke, have hypertension or peripheral artery disease:  no At any time, has a partner hit, kicked or otherwise hurt or frightened you?: no Over the past 2 weeks, have you felt down, depressed or hopeless?: no Over the past 2 weeks, have you felt little interest or pleasure in doing things?:no   Gynecologic History No LMP recorded. (Menstrual status: IUD). Contraception: IUD Last Pap: 2017. Results were: normal Last mammogram: n/a. Results were: n/a  Obstetric History OB History  Gravida Para Term Preterm AB Living  2 2 2  0 0 2  SAB TAB Ectopic Multiple Live Births  0 0 0 0 1    # Outcome Date GA Lbr Len/2nd Weight Sex Delivery Anes PTL Lv  2 Term 08/13/15 [redacted]w[redacted]d 18:43 6 lb 1 oz (2.75 kg) F Vag-Spont None  LIV  1 Term 11/25/06 [redacted]w[redacted]d  7 lb 11 oz (3.487 kg) F Vag-Spont None N LIV    Past Medical History:  Diagnosis Date  . Asthma   . IUD (intrauterine device) in place    Mirena.  States it's been out since June 2015    Past Surgical History:  Procedure Laterality Date  . WISDOM TOOTH EXTRACTION       Current Outpatient Medications:  .  albuterol (PROVENTIL HFA;VENTOLIN HFA) 108 (90 Base) MCG/ACT inhaler, Inhale 2 puffs into the lungs every 6 (six) hours as needed for wheezing or shortness of breath., Disp: 1 Inhaler, Rfl: 2 .  EPINEPHrine (EPIPEN  JR) 0.15 MG/0.3ML injection, Inject 0.3 mLs (0.15 mg total) into the muscle as needed for anaphylaxis., Disp: 2 each, Rfl: PRN .  fluticasone (FLONASE) 50 MCG/ACT nasal spray, Place 2 sprays into both nostrils daily., Disp: 16 g, Rfl: 2 .  levonorgestrel (MIRENA) 20 MCG/24HR IUD, 1 each by Intrauterine route once., Disp: , Rfl:  .  loratadine (CLARITIN) 10 MG tablet, Take 1 tablet (10 mg total) by mouth daily., Disp: 30 tablet, Rfl: 12 .  ibuprofen (ADVIL,MOTRIN) 800 MG tablet, Take 1 tablet (800 mg total) by mouth every 8 (eight) hours as needed., Disp: 30 tablet, Rfl: 5 Allergies  Allergen Reactions  . Bee Venom Anaphylaxis  . Latex Anaphylaxis and Rash    Social History   Tobacco Use  . Smoking status: Never Smoker  . Smokeless tobacco: Never Used  Substance Use Topics  . Alcohol use: No    Alcohol/week: 0.0 oz    Comment: ocassionally beer or wine    Family History  Problem Relation Age of Onset  . Stroke Mother   . Hypertension Father   . Diabetes Father       Review of Systems  Constitutional: negative for fatigue and weight loss Respiratory: negative for cough and wheezing Cardiovascular: negative for chest pain, fatigue and palpitations Gastrointestinal: negative for abdominal pain and change in bowel habits Musculoskeletal:negative for myalgias Neurological: negative for gait  problems and tremors Behavioral/Psych: negative for abusive relationship, depression Endocrine: negative for temperature intolerance    Genitourinary:negative for abnormal menstrual periods, genital lesions, hot flashes, sexual problems and vaginal discharge Integument/breast: negative for breast lump, breast tenderness, nipple discharge and skin lesion(s)    Objective:       BP 117/84   Pulse 96   Ht 5\' 7"  (1.702 m)   Wt 170 lb 12.8 oz (77.5 kg)   BMI 26.75 kg/m  General:   alert  Skin:   no rash or abnormalities  Lungs:   clear to auscultation bilaterally  Heart:   regular rate  and rhythm, S1, S2 normal, no murmur, click, rub or gallop  Breasts:   normal without suspicious masses, skin or nipple changes or axillary nodes  Abdomen:  normal findings: no organomegaly, soft, non-tender and no hernia  Pelvis:  External genitalia: normal general appearance Urinary system: urethral meatus normal and bladder without fullness, nontender Vaginal: normal without tenderness, induration or masses Cervix: normal appearance Adnexa: normal bimanual exam Uterus: anteverted and non-tender, normal size   Lab Review Urine pregnancy test Labs reviewed yes Radiologic studies reviewed yes  50% of 20 min visit spent on counseling and coordination of care.   Assessment:     1. Encounter for gynecological examination with Papanicolaou smear of cervix Rx: - Cytology - PAP  2. Encounter for routine checking of intrauterine contraceptive device (IUD) - pleased with IUD  3. Vaginal discharge Rx: - Cervicovaginal ancillary only  4. Dysmenorrhea Rx: - ibuprofen (ADVIL,MOTRIN) 800 MG tablet; Take 1 tablet (800 mg total) by mouth every 8 (eight) hours as needed.  Dispense: 30 tablet; Refill: 5    Plan:    Education reviewed: calcium supplements, depression evaluation, low fat, low cholesterol diet, safe sex/STD prevention, self breast exams and weight bearing exercise. Contraception: IUD. Follow up in: 1 year.   Meds ordered this encounter  Medications  . ibuprofen (ADVIL,MOTRIN) 800 MG tablet    Sig: Take 1 tablet (800 mg total) by mouth every 8 (eight) hours as needed.    Dispense:  30 tablet    Refill:  5   No orders of the defined types were placed in this encounter.   Brock BadHARLES A. Priscille Shadduck MD 08-22-2017

## 2017-08-26 LAB — CYTOLOGY - PAP
DIAGNOSIS: NEGATIVE
HPV (WINDOPATH): NOT DETECTED

## 2017-08-26 LAB — CERVICOVAGINAL ANCILLARY ONLY
BACTERIAL VAGINITIS: NEGATIVE
Candida vaginitis: POSITIVE — AB
Chlamydia: NEGATIVE
NEISSERIA GONORRHEA: NEGATIVE
TRICH (WINDOWPATH): NEGATIVE

## 2017-08-27 ENCOUNTER — Other Ambulatory Visit: Payer: Self-pay | Admitting: Obstetrics

## 2017-08-27 DIAGNOSIS — B373 Candidiasis of vulva and vagina: Secondary | ICD-10-CM

## 2017-08-27 DIAGNOSIS — B3731 Acute candidiasis of vulva and vagina: Secondary | ICD-10-CM

## 2017-08-27 MED ORDER — FLUCONAZOLE 150 MG PO TABS: 150.0000 mg | ORAL_TABLET | Freq: Once | ORAL | 0 refills | Status: AC

## 2017-10-01 ENCOUNTER — Telehealth: Payer: Self-pay

## 2017-10-01 DIAGNOSIS — B379 Candidiasis, unspecified: Secondary | ICD-10-CM

## 2017-10-01 MED ORDER — FLUCONAZOLE 150 MG PO TABS: 150.0000 mg | ORAL_TABLET | Freq: Once | ORAL | 0 refills | Status: AC

## 2017-10-01 NOTE — Telephone Encounter (Signed)
Pt called requesting rx for diflucan. Pt states that she is currently taking an antibiotic from a tooth extraction, and is now experiencing yeast infection symptoms. Per protocol rx sent to pharmacy

## 2018-04-03 ENCOUNTER — Other Ambulatory Visit: Payer: Self-pay | Admitting: Medical

## 2018-04-03 ENCOUNTER — Other Ambulatory Visit: Payer: Self-pay

## 2018-04-03 ENCOUNTER — Ambulatory Visit: Payer: 59 | Admitting: Medical

## 2018-04-03 VITALS — BP 117/82 | HR 94 | Ht 67.0 in | Wt 171.9 lb

## 2018-04-03 DIAGNOSIS — L089 Local infection of the skin and subcutaneous tissue, unspecified: Secondary | ICD-10-CM | POA: Diagnosis not present

## 2018-04-03 DIAGNOSIS — N611 Abscess of the breast and nipple: Secondary | ICD-10-CM

## 2018-04-03 NOTE — Progress Notes (Addendum)
Presents for Right Breast Pimple x 3  Weeks.  Denies LOF, pain, fever, chills.  Using IUD- Mirena since 09/2015. Last PAP & Chlamydia  (NEG) 08/22/2017.

## 2018-04-03 NOTE — Patient Instructions (Signed)
Hidradenitis Suppurativa  Hidradenitis suppurativa is a long-term (chronic) skin disease. It is similar to a severe form of acne, but it affects areas of the body where acne would be unusual, especially areas of the body where skin rubs against skin and becomes moist. These include:  · Underarms.  · Groin.  · Genital area.  · Buttocks.  · Upper thighs.  · Breasts.  Hidradenitis suppurativa may start out as small lumps or pimples caused by blocked sweat glands or hair follicles. Pimples may develop into deep sores that break open (rupture) and drain pus. Over time, affected areas of skin may thicken and become scarred. This condition is rare and does not spread from person to person (non-contagious).  What are the causes?  The exact cause of this condition is not known. It may be related to:  · Female and female hormones.  · An overactive disease-fighting system (immune system). The immune system may over-react to blocked hair follicles or sweat glands and cause swelling and pus-filled sores.  What increases the risk?  You are more likely to develop this condition if you:  · Are female.  · Are 11-55 years old.  · Have a family history of hidradenitis suppurativa.  · Have a personal history of acne.  · Are overweight.  · Smoke.  · Take the medicine lithium.  What are the signs or symptoms?  The first symptoms are usually painful bumps in the skin, similar to pimples. The condition may get worse over time (progress), or it may only cause mild symptoms. If the disease progresses, symptoms may include:  · Skin bumps getting bigger and growing deeper into the skin.  · Bumps rupturing and draining pus.  · Itchy, infected skin.  · Skin getting thicker and scarred.  · Tunnels under the skin (fistulas) where pus drains from a bump.  · Pain during daily activities, such as pain during walking if your groin area is affected.  · Emotional problems, such as stress or depression. This condition may affect your appearance and your  ability or willingness to wear certain clothes or do certain activities.  How is this diagnosed?  This condition is diagnosed by a health care provider who specializes in skin diseases (dermatologist). You may be diagnosed based on:  · Your symptoms and medical history.  · A physical exam.  · Testing a pus sample for infection.  · Blood tests.  How is this treated?  Your treatment will depend on how severe your symptoms are. The same treatment will not work for everybody with this condition. You may need to try several treatments to find what works best for you. Treatment may include:  · Cleaning and bandaging (dressing) your wounds as needed.  · Lifestyle changes, such as new skin care routines.  · Taking medicines, such as:  ? Antibiotics.  ? Acne medicines.  ? Medicines to reduce the activity of the immune system.  ? A diabetes medicine (metformin).  ? Birth control pills, for women.  ? Steroids to reduce swelling and pain.  · Working with a mental health care provider, if you experience emotional distress due to this condition.  If you have severe symptoms that do not get better with medicine, you may need surgery. Surgery may involve:  · Using a laser to clear the skin and remove hair follicles.  · Opening and draining deep sores.  · Removing the areas of skin that are diseased and scarred.  Follow these instructions at home:    Medicines    · Take over-the-counter and prescription medicines only as told by your health care provider.  · If you were prescribed an antibiotic medicine, take it as told by your health care provider. Do not stop taking the antibiotic even if your condition improves.  Skin care  · If you have open wounds, cover them with a clean dressing as told by your health care provider. Keep wounds clean by washing them gently with soap and water when you bathe.  · Do not shave the areas where you get hidradenitis suppurativa.  · Do not wear deodorant.  · Wear loose-fitting clothes.  · Try to avoid  getting overheated or sweaty. If you get sweaty or wet, change into clean, dry clothes as soon as you can.  · To help relieve pain and itchiness, cover sore areas with a warm, clean washcloth (warm compress) for 5-10 minutes as often as needed.  · If told by your health care provider, take a bleach bath twice a week:  ? Fill your bathtub halfway with water.  ? Pour in ½ cup of unscented household bleach.  ? Soak in the tub for 5-10 minutes.  ? Only soak from the neck down. Avoid water on your face and hair.  ? Shower to rinse off the bleach from your skin.  General instructions  · Learn as much as you can about your disease so that you have an active role in your treatment. Work closely with your health care provider to find treatments that work for you.  · If you are overweight, work with your health care provider to lose weight as recommended.  · Do not use any products that contain nicotine or tobacco, such as cigarettes and e-cigarettes. If you need help quitting, ask your health care provider.  · If you struggle with living with this condition, talk with your health care provider or work with a mental health care provider as recommended.  · Keep all follow-up visits as told by your health care provider. This is important.  Where to find more information  · Hidradenitis Suppurativa [employer].: https://www.hs-foundation.org/  Contact a health care provider if you have:  · A flare-up of hidradenitis suppurativa.  · A fever or chills.  · Trouble controlling your symptoms at home.  · Trouble doing your daily activities because of your symptoms.  · Trouble dealing with emotional problems related to your condition.  Summary  · Hidradenitis suppurativa is a long-term (chronic) skin disease. It is similar to a severe form of acne, but it affects areas of the body where acne would be unusual.  · The first symptoms are usually painful bumps in the skin, similar to pimples. The condition may get worse over time  (progress), or it may only cause mild symptoms.  · If you have open wounds, cover them with a clean dressing as told by your health care provider. Keep wounds clean by washing them gently with soap and water when you bathe.  · Besides skin care, treatment may include medicines, laser treatment, and surgery.  This information is not intended to replace advice given to you by your health care provider. Make sure you discuss any questions you have with your health care provider.  Document Released: 08/22/2003 Document Revised: 01/15/2017 Document Reviewed: 01/15/2017  [employer]vier Interactive Patient Education © 2019 [employer]

## 2018-04-03 NOTE — Progress Notes (Signed)
Patient ID: Katie Riggs, female   DOB: 21-Nov-1985, 33 y.o.   MRN: [id number]  Pimple on right breast Was itching Not painful Getting smaller No drainage No nipple discharge No fever  Not breastfeeding, last child 3 years ago   Warm compress NSAIDs 2 weeks [place] scheduled if symptoms worsen or do not resolve   History:  Katie Riggs is a 33 y.o. [id number] who presents to clinic today for a lump in her right breast x 3 weeks. She states that it looks like a pimple. She denies pain, drainage, nipple discharge, fever today. She is not breastfeeding. She states mild intermittent pruritus.   The following portions of the patient's history were reviewed and updated as appropriate: allergies, current medications, family history, past medical history, social history, past surgical history and problem list.  Review of Systems:  Review of Systems  Constitutional: Negative for fever.  Cardiovascular: Negative for chest pain.      Objective:  Physical Exam BP 117/82   Pulse 94   Ht 5\' 7"  (1.702 m)   Wt 171 lb 14.4 oz (78 kg)   LMP 03/23/2018 (Exact Date)   BMI 26.92 kg/m  Physical Exam  Nursing note and vitals reviewed. Constitutional: She is oriented to person, place, and time. She appears well-developed and well-nourished. No distress.  HENT:  Head: Normocephalic and atraumatic.  Cardiovascular: Normal rate.  Respiratory: Effort normal. Right breast exhibits mass. Right breast exhibits no inverted nipple, no nipple discharge, no skin change and no tenderness. Left breast exhibits no inverted nipple, no mass, no nipple discharge, no skin change and no tenderness. Breasts are symmetrical.    GI: Soft. She exhibits no distension and no mass. There is no abdominal tenderness. There is no rebound and no guarding.  Neurological: She is alert and oriented to person, place, and time.  Skin: Skin is warm and dry. No erythema.  Psychiatric: She has a normal mood and affect.     Assessment & Plan:  1. Pustule - Patient advised to try warm compresses 2-3x/day - If symptoms worsen or no improvement in 2 weeks [facility] BREAST LTD UNI RIGHT INC AXILLA; Future   Marny Lowenstein, PA-C 04/03/2018 11:40 AM

## 2018-04-06 ENCOUNTER — Other Ambulatory Visit: Payer: 59

## 2018-04-17 ENCOUNTER — Other Ambulatory Visit: Payer: 59

## 2018-11-13 ENCOUNTER — Encounter: Payer: Self-pay | Admitting: Obstetrics

## 2018-11-13 ENCOUNTER — Ambulatory Visit (INDEPENDENT_AMBULATORY_CARE_PROVIDER_SITE_OTHER): Payer: 59 | Admitting: Obstetrics

## 2018-11-13 ENCOUNTER — Other Ambulatory Visit: Payer: Self-pay

## 2018-11-13 VITALS — BP 125/82 | HR 120 | Ht 67.0 in | Wt 168.0 lb

## 2018-11-13 DIAGNOSIS — B373 Candidiasis of vulva and vagina: Secondary | ICD-10-CM

## 2018-11-13 DIAGNOSIS — Z01419 Encounter for gynecological examination (general) (routine) without abnormal findings: Secondary | ICD-10-CM

## 2018-11-13 DIAGNOSIS — Z113 Encounter for screening for infections with a predominantly sexual mode of transmission: Secondary | ICD-10-CM | POA: Diagnosis not present

## 2018-11-13 DIAGNOSIS — Z30431 Encounter for routine checking of intrauterine contraceptive device: Secondary | ICD-10-CM

## 2018-11-13 DIAGNOSIS — Z124 Encounter for screening for malignant neoplasm of cervix: Secondary | ICD-10-CM | POA: Diagnosis not present

## 2018-11-13 DIAGNOSIS — Z1151 Encounter for screening for human papillomavirus (HPV): Secondary | ICD-10-CM | POA: Diagnosis not present

## 2018-11-13 DIAGNOSIS — N898 Other specified noninflammatory disorders of vagina: Secondary | ICD-10-CM | POA: Diagnosis not present

## 2018-11-13 DIAGNOSIS — Z0000 Encounter for general adult medical examination without abnormal findings: Secondary | ICD-10-CM

## 2018-11-13 MED ORDER — VITAFOL ULTRA 29-0.6-0.4-200 MG PO CAPS: 1.0000 | ORAL_CAPSULE | Freq: Every day | ORAL | 4 refills | Status: AC

## 2018-11-13 NOTE — Progress Notes (Signed)
Subjective:        Katie Riggs is a 33 y.o. female here for a routine exam.  Current complaints: None.    Personal health questionnaire:  Is patient Ashkenazi Jewish, have a family history of breast and/or ovarian cancer: no Is there a family history of uterine cancer diagnosed at age < 56, gastrointestinal cancer, urinary tract cancer, family member who is a Personnel officer syndrome-associated carrier: no Is the patient overweight and hypertensive, family history of diabetes, personal history of gestational diabetes, preeclampsia or PCOS: no Is patient over 54, have PCOS,  family history of premature CHD under age 58, diabetes, smoke, have hypertension or peripheral artery disease:  no At any time, has a partner hit, kicked or otherwise hurt or frightened you?: no Over the past 2 weeks, have you felt down, depressed or hopeless?: no Over the past 2 weeks, have you felt little interest or pleasure in doing things?:no   Gynecologic History No LMP recorded. (Menstrual status: IUD). Contraception: IUD ( Mirena ) Last Pap: 08-22-2017. Results were: normal Last mammogram: n/a. Results were: n/a  Obstetric History OB History  Gravida Para Term Preterm AB Living  2 2 2  0 0 2  SAB TAB Ectopic Multiple Live Births  0 0 0 0 1    # Outcome Date GA Lbr Len/2nd Weight Sex Delivery Anes PTL Lv  2 Term 08/13/15 [redacted]w[redacted]d 18:43 6 lb 1 oz (2.75 kg) F Vag-Spont None  LIV  1 Term 11/25/06 [redacted]w[redacted]d  7 lb 11 oz (3.487 kg) F Vag-Spont None N LIV    Past Medical History:  Diagnosis Date  . Asthma   . IUD (intrauterine device) in place    Mirena.  States it's been out since June 2015    Past Surgical History:  Procedure Laterality Date  . WISDOM TOOTH EXTRACTION       Current Outpatient Medications:  .  albuterol (PROVENTIL HFA;VENTOLIN HFA) 108 (90 Base) MCG/ACT inhaler, Inhale 2 puffs into the lungs every 6 (six) hours as needed for wheezing or shortness of breath. (Patient not taking: Reported on  11/13/2018), Disp: 1 Inhaler, Rfl: 2 .  EPINEPHrine (EPIPEN JR) 0.15 MG/0.3ML injection, Inject 0.3 mLs (0.15 mg total) into the muscle as needed for anaphylaxis. (Patient not taking: Reported on 11/13/2018), Disp: 2 each, Rfl: PRN .  fluticasone (FLONASE) 50 MCG/ACT nasal spray, Place 2 sprays into both nostrils daily., Disp: 16 g, Rfl: 2 .  ibuprofen (ADVIL,MOTRIN) 800 MG tablet, Take 1 tablet (800 mg total) by mouth every 8 (eight) hours as needed. (Patient not taking: Reported on 11/13/2018), Disp: 30 tablet, Rfl: 5 .  levonorgestrel (MIRENA) 20 MCG/24HR IUD, 1 each by Intrauterine route once., Disp: , Rfl:  .  loratadine (CLARITIN) 10 MG tablet, Take 1 tablet (10 mg total) by mouth daily. (Patient not taking: Reported on 11/13/2018), Disp: 30 tablet, Rfl: 12 .  Prenat-Fe Poly-Methfol-FA-DHA (VITAFOL ULTRA) 29-0.6-0.4-200 MG CAPS, Take 1 capsule by mouth daily before breakfast., Disp: 90 capsule, Rfl: 4 Allergies  Allergen Reactions  . Bee Venom Anaphylaxis  . Latex Anaphylaxis and Rash    Social History   Tobacco Use  . Smoking status: Never Smoker  . Smokeless tobacco: Never Used  Substance Use Topics  . Alcohol use: Yes    Alcohol/week: 0.0 standard drinks    Comment: ocassionally beer or wine    Family History  Problem Relation Age of Onset  . Stroke Mother   . Hypertension Father   .  Diabetes Father       Review of Systems  Constitutional: negative for fatigue and weight loss Respiratory: negative for cough and wheezing Cardiovascular: negative for chest pain, fatigue and palpitations Gastrointestinal: negative for abdominal pain and change in bowel habits Musculoskeletal:negative for myalgias Neurological: negative for gait problems and tremors Behavioral/Psych: negative for abusive relationship, depression Endocrine: negative for temperature intolerance    Genitourinary:negative for abnormal menstrual periods, genital lesions, hot flashes, sexual problems and  vaginal discharge Integument/breast: negative for breast lump, breast tenderness, nipple discharge and skin lesion(s)    Objective:       BP 125/82   Pulse (!) 120   Ht 5\' 7"  (1.702 m)   Wt 168 lb (76.2 kg)   BMI 26.31 kg/m  General:   alert  Skin:   no rash or abnormalities  Lungs:   clear to auscultation bilaterally  Heart:   regular rate and rhythm, S1, S2 normal, no murmur, click, rub or gallop  Breasts:   normal without suspicious masses, skin or nipple changes or axillary nodes  Abdomen:  normal findings: no organomegaly, soft, non-tender and no hernia  Pelvis:  External genitalia: normal general appearance Urinary system: urethral meatus normal and bladder without fullness, nontender Vaginal: normal without tenderness, induration or masses Cervix: normal appearance Adnexa: normal bimanual exam Uterus: anteverted and non-tender, normal size   Lab Review Urine pregnancy test Labs reviewed yes Radiologic studies reviewed no  50% of 25 min visit spent on counseling and coordination of care.   Assessment:     1. Encounter for routine gynecological examination with Papanicolaou smear of cervix Rx: - Cytology - PAP( [place])  2. Vaginal discharge Rx: - Cervicovaginal ancillary only( [facility])  3. Screen for STD (sexually transmitted disease) Rx: - HIV Antibody (routine testing w rflx) - Hepatitis B surface antigen - RPR - Hepatitis C antibody  4. Encounter for management of intrauterine contraceptive device (IUD), unspecified IUD management type - pleased with Mirena IUD  5. Routine adult health maintenance Rx: - Prenat-Fe Poly-Methfol-FA-DHA (VITAFOL ULTRA) 29-0.6-0.4-200 MG CAPS; Take 1 capsule by mouth daily before breakfast.  Dispense: 90 capsule; Refill: 4    Plan:    Education reviewed: calcium supplements, depression evaluation, low fat, low cholesterol diet, safe sex/STD prevention, self breast exams and weight bearing  exercise. Contraception: IUD. Follow up in: 1 year.   Meds ordered this encounter  Medications  . Prenat-Fe Poly-Methfol-FA-DHA (VITAFOL ULTRA) 29-0.6-0.4-200 MG CAPS    Sig: Take 1 capsule by mouth daily before breakfast.    Dispense:  90 capsule    Refill:  4   Orders Placed This Encounter  Procedures  . HIV Antibody (routine testing w rflx)  . Hepatitis B surface antigen  . RPR  . Hepatitis C antibody    Shelly Bombard, MD 11/13/2018 12:08 PM

## 2018-11-14 LAB — HEPATITIS C ANTIBODY: Hep C Virus Ab: <0.1 s/co ratio (ref 0.0–0.9)

## 2018-11-14 LAB — HIV ANTIBODY (ROUTINE TESTING W REFLEX): HIV Screen 4th Generation wRfx: NONREACTIVE

## 2018-11-14 LAB — HEPATITIS B SURFACE ANTIGEN: Hepatitis B Surface Ag: NEGATIVE

## 2018-11-14 LAB — RPR: RPR Ser Ql: NONREACTIVE

## 2018-11-16 ENCOUNTER — Other Ambulatory Visit: Payer: Self-pay | Admitting: Obstetrics

## 2018-11-16 DIAGNOSIS — B3731 Acute candidiasis of vulva and vagina: Secondary | ICD-10-CM

## 2018-11-16 DIAGNOSIS — B373 Candidiasis of vulva and vagina: Secondary | ICD-10-CM

## 2018-11-16 MED ORDER — TERCONAZOLE 0.4 % VA CREA: 1.0000 | TOPICAL_CREAM | Freq: Every day | VAGINAL | 0 refills | Status: AC

## 2018-11-17 LAB — CYTOLOGY - PAP
Comment: NEGATIVE
Diagnosis: NEGATIVE
High risk HPV: NEGATIVE

## 2018-11-18 ENCOUNTER — Other Ambulatory Visit: Payer: Self-pay | Admitting: Obstetrics

## 2018-11-18 ENCOUNTER — Telehealth: Payer: Self-pay

## 2018-11-18 NOTE — Telephone Encounter (Signed)
S/w pt and advised that provider sent rx 

## 2018-11-19 ENCOUNTER — Other Ambulatory Visit: Payer: Self-pay | Admitting: Obstetrics

## 2018-11-19 LAB — CERVICOVAGINAL ANCILLARY ONLY
Bacterial Vaginitis (gardnerella): NEGATIVE
Candida Glabrata: NEGATIVE
Candida Vaginitis: POSITIVE — AB
Chlamydia: NEGATIVE
Comment: NEGATIVE
Comment: NEGATIVE
Comment: NEGATIVE
Comment: NEGATIVE
Comment: NEGATIVE
Comment: NORMAL
Neisseria Gonorrhea: NEGATIVE
Trichomonas: NEGATIVE

## 2021-09-26 ENCOUNTER — Encounter: Payer: Self-pay | Admitting: Internal Medicine

## 2021-10-30 ENCOUNTER — Encounter: Payer: Self-pay | Admitting: Internal Medicine
